# Patient Record
Sex: Male | Born: 1980 | Race: White | Hispanic: No | Marital: Single | Smoking: Current every day smoker
Health system: Southern US, Community
[De-identification: ages and names within clinical notes are randomized; demographics above are authoritative.]

---

## 2020-03-15 ENCOUNTER — Other Ambulatory Visit: Payer: Self-pay | Admitting: Gastroenterology

## 2020-03-15 DIAGNOSIS — R110 Nausea: Secondary | ICD-10-CM

## 2020-03-16 ENCOUNTER — Emergency Department (HOSPITAL_COMMUNITY)
Admission: EM | Admit: 2020-03-16 | Discharge: 2020-03-17 | Disposition: A | Discharge status: Other Acute Inpatient Hospital | Payer: PRIVATE HEALTH INSURANCE | Attending: Emergency Medicine | Admitting: Emergency Medicine

## 2020-03-16 ENCOUNTER — Encounter (HOSPITAL_COMMUNITY): Payer: Self-pay | Admitting: Emergency Medicine

## 2020-03-16 ENCOUNTER — Other Ambulatory Visit: Payer: Self-pay

## 2020-03-16 ENCOUNTER — Emergency Department (HOSPITAL_COMMUNITY): Payer: PRIVATE HEALTH INSURANCE

## 2020-03-16 DIAGNOSIS — Z20822 Contact with and (suspected) exposure to covid-19: Secondary | ICD-10-CM | POA: Insufficient documentation

## 2020-03-16 DIAGNOSIS — S61512A Laceration without foreign body of left wrist, initial encounter: Secondary | ICD-10-CM

## 2020-03-16 DIAGNOSIS — S0101XA Laceration without foreign body of scalp, initial encounter: Secondary | ICD-10-CM | POA: Insufficient documentation

## 2020-03-16 DIAGNOSIS — R109 Unspecified abdominal pain: Secondary | ICD-10-CM | POA: Insufficient documentation

## 2020-03-16 DIAGNOSIS — M542 Cervicalgia: Secondary | ICD-10-CM | POA: Insufficient documentation

## 2020-03-16 DIAGNOSIS — X838XXA Intentional self-harm by other specified means, initial encounter: Secondary | ICD-10-CM | POA: Insufficient documentation

## 2020-03-16 DIAGNOSIS — Y9289 Other specified places as the place of occurrence of the external cause: Secondary | ICD-10-CM | POA: Insufficient documentation

## 2020-03-16 DIAGNOSIS — Z23 Encounter for immunization: Secondary | ICD-10-CM | POA: Insufficient documentation

## 2020-03-16 DIAGNOSIS — T1491XA Suicide attempt, initial encounter: Secondary | ICD-10-CM

## 2020-03-16 DIAGNOSIS — F17210 Nicotine dependence, cigarettes, uncomplicated: Secondary | ICD-10-CM | POA: Insufficient documentation

## 2020-03-16 DIAGNOSIS — F32A Depression, unspecified: Secondary | ICD-10-CM

## 2020-03-16 DIAGNOSIS — F329 Major depressive disorder, single episode, unspecified: Secondary | ICD-10-CM | POA: Insufficient documentation

## 2020-03-16 DIAGNOSIS — Y9389 Activity, other specified: Secondary | ICD-10-CM | POA: Insufficient documentation

## 2020-03-16 DIAGNOSIS — S199XXA Unspecified injury of neck, initial encounter: Secondary | ICD-10-CM

## 2020-03-16 DIAGNOSIS — Y999 Unspecified external cause status: Secondary | ICD-10-CM | POA: Insufficient documentation

## 2020-03-16 LAB — COMPREHENSIVE METABOLIC PANEL
ALT: 33 U/L (ref 0–44)
AST: 29 U/L (ref 15–41)
Albumin: 3.8 g/dL (ref 3.5–5.0)
Alkaline Phosphatase: 68 U/L (ref 38–126)
Anion gap: 13 (ref 5–15)
BUN: 8 mg/dL (ref 6–20)
CO2: 19 mmol/L — ABNORMAL LOW (ref 22–32)
Calcium: 8.7 mg/dL — ABNORMAL LOW (ref 8.9–10.3)
Chloride: 106 mmol/L (ref 98–111)
Creatinine, Ser: 0.67 mg/dL (ref 0.61–1.24)
GFR calc Af Amer: >60 mL/min (ref >60)
GFR calc non Af Amer: >60 mL/min (ref >60)
Glucose, Bld: 127 mg/dL — ABNORMAL HIGH (ref 70–99)
Potassium: 3.9 mmol/L (ref 3.5–5.1)
Sodium: 138 mmol/L (ref 135–145)
Total Bilirubin: 0.8 mg/dL (ref 0.3–1.2)
Total Protein: 6.6 g/dL (ref 6.5–8.1)

## 2020-03-16 LAB — CBC WITH DIFFERENTIAL/PLATELET
Abs Immature Granulocytes: 0.05 10*3/uL (ref 0.00–0.07)
Basophils Absolute: 0 10*3/uL (ref 0.0–0.1)
Basophils Relative: 0 %
Eosinophils Absolute: 0 10*3/uL (ref 0.0–0.5)
Eosinophils Relative: 0 %
HCT: 44.4 % (ref 39.0–52.0)
Hemoglobin: 15.3 g/dL (ref 13.0–17.0)
Immature Granulocytes: 0 %
Lymphocytes Relative: 12 %
Lymphs Abs: 1.8 10*3/uL (ref 0.7–4.0)
MCH: 32.1 pg (ref 26.0–34.0)
MCHC: 34.5 g/dL (ref 30.0–36.0)
MCV: 93.3 fL (ref 80.0–100.0)
Monocytes Absolute: 1.1 10*3/uL — ABNORMAL HIGH (ref 0.1–1.0)
Monocytes Relative: 8 %
Neutro Abs: 11.6 10*3/uL — ABNORMAL HIGH (ref 1.7–7.7)
Neutrophils Relative %: 80 %
Platelets: 265 10*3/uL (ref 150–400)
RBC: 4.76 MIL/uL (ref 4.22–5.81)
RDW: 12.1 % (ref 11.5–15.5)
WBC: 14.7 10*3/uL — ABNORMAL HIGH (ref 4.0–10.5)
nRBC: 0 % (ref 0.0–0.2)

## 2020-03-16 LAB — SALICYLATE LEVEL: Salicylate Lvl: <7 mg/dL — ABNORMAL LOW (ref 7.0–30.0)

## 2020-03-16 LAB — URINALYSIS, ROUTINE W REFLEX MICROSCOPIC
Bilirubin Urine: NEGATIVE
Glucose, UA: NEGATIVE mg/dL
Hgb urine dipstick: NEGATIVE
Ketones, ur: 5 mg/dL — AB
Leukocytes,Ua: NEGATIVE
Nitrite: NEGATIVE
Protein, ur: NEGATIVE mg/dL
Specific Gravity, Urine: 1.032 — ABNORMAL HIGH (ref 1.005–1.030)
pH: 5 (ref 5.0–8.0)

## 2020-03-16 LAB — RAPID URINE DRUG SCREEN, HOSP PERFORMED
Amphetamines: NOT DETECTED
Barbiturates: NOT DETECTED
Benzodiazepines: NOT DETECTED
Cocaine: NOT DETECTED
Opiates: NOT DETECTED
Tetrahydrocannabinol: NOT DETECTED

## 2020-03-16 LAB — ACETAMINOPHEN LEVEL: Acetaminophen (Tylenol), Serum: <10 ug/mL — ABNORMAL LOW (ref 10–30)

## 2020-03-16 LAB — ETHANOL: Alcohol, Ethyl (B): 221 mg/dL — ABNORMAL HIGH (ref <10)

## 2020-03-16 LAB — SARS CORONAVIRUS 2 BY RT PCR (HOSPITAL ORDER, PERFORMED IN ~~LOC~~ HOSPITAL LAB): SARS Coronavirus 2: NEGATIVE

## 2020-03-16 MED ORDER — THIAMINE HCL 100 MG PO TABS
100.0000 mg | ORAL_TABLET | Freq: Every day | ORAL | Status: DC
  Administered 2020-03-16: 100 mg via ORAL
  Filled 2020-03-16: qty 1

## 2020-03-16 MED ORDER — LORAZEPAM 2 MG/ML IJ SOLN: 0.0000 mg | Freq: Two times a day (BID) | INTRAMUSCULAR | Status: DC

## 2020-03-16 MED ORDER — IBUPROFEN 400 MG PO TABS
600.0000 mg | ORAL_TABLET | Freq: Three times a day (TID) | ORAL | Status: DC | PRN
  Administered 2020-03-17: 600 mg via ORAL
  Filled 2020-03-16: qty 1

## 2020-03-16 MED ORDER — NICOTINE 21 MG/24HR TD PT24
21.0000 mg | MEDICATED_PATCH | Freq: Every day | TRANSDERMAL | Status: DC
  Filled 2020-03-16: qty 1

## 2020-03-16 MED ORDER — HYDROXYZINE HCL 25 MG PO TABS
25.0000 mg | ORAL_TABLET | Freq: Once | ORAL | Status: AC
  Administered 2020-03-16: 25 mg via ORAL
  Filled 2020-03-16: qty 1

## 2020-03-16 MED ORDER — ONDANSETRON HCL 4 MG PO TABS
4.0000 mg | ORAL_TABLET | Freq: Three times a day (TID) | ORAL | Status: DC | PRN
  Administered 2020-03-17: 4 mg via ORAL
  Filled 2020-03-16: qty 1

## 2020-03-16 MED ORDER — LORAZEPAM 1 MG PO TABS
0.0000 mg | ORAL_TABLET | Freq: Four times a day (QID) | ORAL | Status: DC
  Administered 2020-03-16: 1 mg via ORAL
  Filled 2020-03-16: qty 1

## 2020-03-16 MED ORDER — HYDROCODONE-ACETAMINOPHEN 5-325 MG PO TABS: 1.0000 | ORAL_TABLET | ORAL | Status: DC | PRN

## 2020-03-16 MED ORDER — TETANUS-DIPHTH-ACELL PERTUSSIS 5-2.5-18.5 LF-MCG/0.5 IM SUSP
0.5000 mL | Freq: Once | INTRAMUSCULAR | Status: AC
  Administered 2020-03-16: 0.5 mL via INTRAMUSCULAR
  Filled 2020-03-16: qty 0.5

## 2020-03-16 MED ORDER — LORAZEPAM 1 MG PO TABS: 0.0000 mg | ORAL_TABLET | Freq: Two times a day (BID) | ORAL | Status: DC

## 2020-03-16 MED ORDER — ZOLPIDEM TARTRATE 5 MG PO TABS
5.0000 mg | ORAL_TABLET | Freq: Every evening | ORAL | Status: DC | PRN
  Administered 2020-03-16: 5 mg via ORAL
  Filled 2020-03-16: qty 1

## 2020-03-16 MED ORDER — IOHEXOL 300 MG/ML  SOLN
100.0000 mL | Freq: Once | INTRAMUSCULAR | Status: AC | PRN
  Administered 2020-03-16: 100 mL via INTRAVENOUS

## 2020-03-16 MED ORDER — THIAMINE HCL 100 MG/ML IJ SOLN: 100.0000 mg | Freq: Every day | INTRAMUSCULAR | Status: DC

## 2020-03-16 MED ORDER — SODIUM CHLORIDE 0.9 % IV BOLUS
500.0000 mL | Freq: Once | INTRAVENOUS | Status: AC
  Administered 2020-03-16: 500 mL via INTRAVENOUS

## 2020-03-16 MED ORDER — HYDROCODONE-ACETAMINOPHEN 5-325 MG PO TABS
1.0000 | ORAL_TABLET | Freq: Once | ORAL | Status: AC
  Administered 2020-03-16: 1 via ORAL
  Filled 2020-03-16: qty 1

## 2020-03-16 MED ORDER — HYDROXYZINE HCL 25 MG PO TABS
25.0000 mg | ORAL_TABLET | Freq: Four times a day (QID) | ORAL | Status: DC | PRN
  Administered 2020-03-16: 25 mg via ORAL
  Filled 2020-03-16: qty 1

## 2020-03-16 MED ORDER — LORAZEPAM 2 MG/ML IJ SOLN: 0.0000 mg | Freq: Four times a day (QID) | INTRAMUSCULAR | Status: DC

## 2020-03-16 NOTE — ED Provider Notes (Signed)
[facility]   CSN: [id number] Arrival date & time: 03/16/20  1605     History Chief Complaint  Patient presents with   Fall   Suicide Attempt    William Mccoy is a 39 y.o. male.  HPI He presents by EMS for evaluation of suspected self-inflicted injuries.  Patient was apparently in a storage building, at her workplace, and was found on the couch, after EMS was called by his wife.  EMS report that they suspect that he hung himself, by climbing up onto a ladder.  They state that a ladder was present, but no clear sign of a noose.  They report that the ladder was a stepladder, about 6 feet tall.  There were extension cord scattered around and it is suspected that these could have been used as a ligature.  Patient also reported to them that he cut his left wrist, and an attempt at self-harm.  He is also reported to be intoxicated with alcohol.  He apparently sustained an injury to his head at some point.  I saw the patient immediately on arrival.  He was alert, and conversant.  He reports that he tried to hang himself using a extension cord.  He admits to injuring his left wrist intentionally.  When I asked him why he did that he would not state answered.  He is clearly intoxicated and has dysarthria and difficulty carrying on a lucid conversation.  Level 5 caveat-altered mental status    History reviewed. No pertinent past medical history.  There are no problems to display for this patient.   History reviewed. No pertinent surgical history.     History reviewed. No pertinent family history.  Social History   Tobacco Use   Smoking status: Current Every Day Smoker    Packs/day: 1.00    Types: Cigarettes   Smokeless tobacco: Never Used  Substance Use Topics   Alcohol use: Yes    Alcohol/week: 6.0 standard drinks    Types: 6 Shots of liquor per week    Comment: every day   Drug use: Never    Home Medications Prior  to Admission medications   Medication Sig Start Date End Date Taking? Authorizing Provider  melatonin 1 MG TABS tablet Take 3 mg by mouth at bedtime as needed (sleep).   Yes [provider]  nicotine (NICODERM CQ - DOSED IN MG/24 HOURS) 14 mg/24hr patch Place 14 mg onto the skin daily.   Yes [provider]    Allergies    Patient has no known allergies.  Review of Systems   Review of Systems  Unable to perform ROS: Mental status change    Physical Exam Updated Vital Signs BP 118/87    Pulse (!) 104    Temp 98.3 F (36.8 C) (Oral)    Resp (!) 21    SpO2 96%   Physical Exam Vitals and nursing note reviewed.  Constitutional:      General: He is in acute distress.     Appearance: He is well-developed. He is ill-appearing and toxic-appearing. He is not diaphoretic.  HENT:     Head: Normocephalic and atraumatic.     Right Ear: External ear normal.     Left Ear: External ear normal.  Eyes:     Conjunctiva/sclera: Conjunctivae normal.     Pupils: Pupils are equal, round, and reactive to light.  Neck:     Trachea: Phonation normal.  Cardiovascular:  Rate and Rhythm: Normal rate and regular rhythm.     Heart sounds: Normal heart sounds.  Pulmonary:     Effort: Pulmonary effort is normal.     Breath sounds: Normal breath sounds.  Chest:     Chest wall: No tenderness.  Abdominal:     General: There is no distension.     Palpations: Abdomen is soft. There is no mass.     Tenderness: There is abdominal tenderness.     Hernia: No hernia is present.  Musculoskeletal:        General: Normal range of motion.     Cervical back: Normal range of motion and neck supple.     Comments: Superficial horizontal laceration left volar wrist, without swelling, or deep injury.  Skin:    General: Skin is warm and dry.  Neurological:     Mental Status: He is alert.     Motor: No abnormal muscle tone.     Comments: Dysarthria is present.  He moves extremities equally.  No  facial asymmetry.  He does not follow commands well for strength testing.  Psychiatric:        Attention and Perception: He is inattentive.        Mood and Affect: Mood is depressed.        Speech: Speech is delayed.        Behavior: Behavior is slowed.        Thought Content: Thought content includes suicidal ideation. Thought content includes suicidal plan.        Cognition and Memory: Cognition is impaired.     ED Results / Procedures / Treatments   Labs (all labs ordered are listed, but only abnormal results are displayed) Labs Reviewed  COMPREHENSIVE METABOLIC PANEL - Abnormal; Notable for the following components:      Result Value   CO2 19 (*)    Glucose, Bld 127 (*)    Calcium 8.7 (*)    All other components within normal limits  CBC WITH DIFFERENTIAL/PLATELET - Abnormal; Notable for the following components:   WBC 14.7 (*)    Neutro Abs 11.6 (*)    Monocytes Absolute 1.1 (*)    All other components within normal limits  SALICYLATE LEVEL - Abnormal; Notable for the following components:   Salicylate Lvl <7.0 (*)    All other components within normal limits  ACETAMINOPHEN LEVEL - Abnormal; Notable for the following components:   Acetaminophen (Tylenol), Serum <10 (*)    All other components within normal limits  ETHANOL - Abnormal; Notable for the following components:   Alcohol, Ethyl (B) 221 (*)    All other components within normal limits  URINALYSIS, ROUTINE W REFLEX MICROSCOPIC - Abnormal; Notable for the following components:   Specific Gravity, Urine 1.032 (*)    Ketones, ur 5 (*)    All other components within normal limits  SARS CORONAVIRUS 2 BY RT PCR (HOSPITAL ORDER, PERFORMED IN [facility])  RAPID URINE DRUG SCREEN, HOSP PERFORMED    EKG None  Radiology CT Head Wo Contrast  Result Date: 03/16/2020 CLINICAL DATA:  Polytrauma, critical, head/C-spine injury suspected EXAM: CT HEAD WITHOUT CONTRAST TECHNIQUE: Contiguous axial images were  obtained from the base of the skull through the vertex without intravenous contrast. COMPARISON:  None. FINDINGS: Brain: No intracranial hemorrhage, mass effect, or midline shift. Gray-white differentiation is preserved. No hydrocephalus. The basilar cisterns are patent. No evidence of territorial infarct or acute ischemia. No extra-axial or intracranial fluid  collection. Vascular: No hyperdense vessel or unexpected calcification. Skull: No fracture or focal lesion. Sinuses/Orbits: Right frontal osteoma. No acute findings. Included orbits are unremarkable. Mastoid air cells are clear. Other: None. IMPRESSION: No acute intracranial abnormality. No skull fracture. Electronically Signed   By: Narda Rutherford M.D.   On: 03/16/2020 19:30   CT Chest W Contrast  Result Date: 03/16/2020 CLINICAL DATA:  Concern for fall secondary to suicide attempt. EXAM: CT CHEST, ABDOMEN, AND PELVIS WITH CONTRAST TECHNIQUE: Multidetector CT imaging of the chest, abdomen and pelvis was performed following the standard protocol during bolus administration of intravenous contrast. CONTRAST:  OMNIPAQUE IOHEXOL 300 MG/ML  SOLN COMPARISON:  None. FINDINGS: CT CHEST FINDINGS Cardiovascular: The thoracic aorta is unremarkable. The heart size is unremarkable. There is no significant pericardial effusion. There is no large centrally located pulmonary embolism. Mediastinum/Nodes: -- No mediastinal lymphadenopathy. -- No hilar lymphadenopathy. -- No axillary lymphadenopathy. -- No supraclavicular lymphadenopathy. -- Normal thyroid gland where visualized. -  Unremarkable esophagus. Lungs/Pleura: Airways are patent. No pleural effusion, lobar consolidation, pneumothorax or pulmonary infarction. Musculoskeletal: No chest wall abnormality. No bony spinal canal stenosis. CT ABDOMEN PELVIS FINDINGS Hepatobiliary: The liver is normal. Normal gallbladder.There is no biliary ductal dilation. Pancreas: Normal contours without ductal dilatation. No  peripancreatic fluid collection. Spleen: Unremarkable. Adrenals/Urinary Tract: --Adrenal glands: Unremarkable. --Right kidney/ureter: No hydronephrosis or radiopaque kidney stones. --Left kidney/ureter: No hydronephrosis or radiopaque kidney stones. --Urinary bladder: Unremarkable. Stomach/Bowel: --Stomach/Duodenum: No hiatal hernia or other gastric abnormality. Normal duodenal course and caliber. --Small bowel: Unremarkable. --Colon: Unremarkable. --Appendix: Normal. Vascular/Lymphatic: Normal course and caliber of the major abdominal vessels. --No retroperitoneal lymphadenopathy. --No mesenteric lymphadenopathy. --No pelvic or inguinal lymphadenopathy. Reproductive: Unremarkable Other: No ascites or free air. The abdominal wall is normal. Musculoskeletal. 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 IMPRESSION: No acute thoracic, abdominal or pelvic injury. Electronically Signed   By: Katherine Mantle M.D.   On: 03/16/2020 19:40   CT Cervical Spine Wo Contrast  Result Date: 03/16/2020 CLINICAL DATA:  Polytrauma, critical, head/C-spine injury suspected EXAM: CT CERVICAL SPINE WITHOUT CONTRAST TECHNIQUE: Multidetector CT imaging of the cervical spine was performed without intravenous contrast. Multiplanar CT image reconstructions were also generated. COMPARISON:  None. FINDINGS: Alignment: Normal. Skull base and vertebrae: No acute fracture. Vertebral body heights are maintained. The dens and skull base are intact. Soft tissues and spinal canal: No prevertebral fluid or swelling. No visible canal hematoma. Mild soft tissue edema anterior to the left sternocleidomastoid muscle. Disc levels: Endplate spurring from C3-C4 through C6-C7. Mild scattered disc space narrowing. Upper chest: Assessed on concurrent chest CT, reported separately. Other: None. IMPRESSION: Mild degenerative change in the cervical spine without acute fracture or subluxation. Electronically Signed    By: Narda Rutherford M.D.   On: 03/16/2020 19:33   CT Abdomen Pelvis W Contrast  Result Date: 03/16/2020 CLINICAL DATA:  Concern for fall secondary to suicide attempt. EXAM: CT CHEST, ABDOMEN, AND PELVIS WITH CONTRAST TECHNIQUE: Multidetector CT imaging of the chest, abdomen and pelvis was performed following the standard protocol during bolus administration of intravenous contrast. CONTRAST:  OMNIPAQUE IOHEXOL 300 MG/ML  SOLN COMPARISON:  None. FINDINGS: CT CHEST FINDINGS Cardiovascular: The thoracic aorta is unremarkable. The heart size is unremarkable. There is no significant pericardial effusion. There is no large centrally located pulmonary embolism. Mediastinum/Nodes: -- No mediastinal lymphadenopathy. -- No hilar lymphadenopathy. -- No axillary lymphadenopathy. -- No supraclavicular lymphadenopathy. -- Normal  thyroid gland where visualized. -  Unremarkable esophagus. Lungs/Pleura: Airways are patent. No pleural effusion, lobar consolidation, pneumothorax or pulmonary infarction. Musculoskeletal: No chest wall abnormality. No bony spinal canal stenosis. CT ABDOMEN PELVIS FINDINGS Hepatobiliary: The liver is normal. Normal gallbladder.There is no biliary ductal dilation. Pancreas: Normal contours without ductal dilatation. No peripancreatic fluid collection. Spleen: Unremarkable. Adrenals/Urinary Tract: --Adrenal glands: Unremarkable. --Right kidney/ureter: No hydronephrosis or radiopaque kidney stones. --Left kidney/ureter: No hydronephrosis or radiopaque kidney stones. --Urinary bladder: Unremarkable. Stomach/Bowel: --Stomach/Duodenum: No hiatal hernia or other gastric abnormality. Normal duodenal course and caliber. --Small bowel: Unremarkable. --Colon: Unremarkable. --Appendix: Normal. Vascular/Lymphatic: Normal course and caliber of the major abdominal vessels. --No retroperitoneal lymphadenopathy. --No mesenteric lymphadenopathy. --No pelvic or inguinal lymphadenopathy. Reproductive: Unremarkable  Other: No ascites or free air. The abdominal wall is normal. Musculoskeletal. 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 IMPRESSION: No acute thoracic, abdominal or pelvic injury. Electronically Signed   By: Katherine Mantle M.D.   On: 03/16/2020 19:40    Procedures .Marland KitchenLaceration Repair  Date/Time: 03/16/2020 8:35 PM Performed by: Mancel Bale, MD Authorized by: Mancel Bale, MD   Consent:    Consent obtained:  Verbal   Consent given by:  Patient   Risks discussed:  Pain and need for additional repair   Alternatives discussed:  No treatment Anesthesia (see MAR for exact dosages):    Anesthesia method:  None Laceration details:    Location:  Scalp   Length (cm):  3   Depth (mm):  9 Repair type:    Repair type:  Simple Exploration:    Hemostasis achieved with:  Direct pressure   Wound exploration: wound explored through full range of motion     Wound extent: no areolar tissue violation noted, no fascia violation noted, no foreign bodies/material noted, no muscle damage noted and no vascular damage noted     Contaminated: no   Treatment:    Area cleansed with:  Saline   Amount of cleaning:  Standard   Irrigation solution:  Sterile saline   Irrigation volume:  20 cc   Irrigation method:  Syringe   Visualized foreign bodies/material removed: no   Skin repair:    Repair method:  Staples   Number of staples:  2 Approximation:    Approximation:  Loose Post-procedure details:    Dressing:  Antibiotic ointment   Patient tolerance of procedure:  Tolerated well, no immediate complications .Critical Care Performed by: Mancel Bale, MD Authorized by: Mancel Bale, MD   Critical care provider statement:    Critical care time (minutes):  35   Critical care start time:  03/16/2020 4:05 PM   Critical care end time:  03/16/2020 8:42 PM   Critical care time was exclusive of:  Separately billable procedures and treating other patients    Critical care was necessary to treat or prevent imminent or life-threatening deterioration of the following conditions:  Toxidrome (Psychiatric crisis)   Critical care was time spent personally by me on the following activities:  Blood draw for specimens, development of treatment plan with patient or surrogate, discussions with consultants, evaluation of patient's response to treatment, examination of patient, obtaining history from patient or surrogate, ordering and performing treatments and interventions, ordering and review of laboratory studies, pulse oximetry, re-evaluation of patient's condition, review of old charts and ordering and review of radiographic studies   (including critical care time)  Medications Ordered in ED Medications  ibuprofen (ADVIL) tablet 600  mg (has no administration in time range)  zolpidem (AMBIEN) tablet 5 mg (has no administration in time range)  ondansetron (ZOFRAN) tablet 4 mg (has no administration in time range)  HYDROcodone-acetaminophen (NORCO/VICODIN) 5-325 MG per tablet 1 tablet (has no administration in time range)  hydrOXYzine (ATARAX/VISTARIL) tablet 25 mg (has no administration in time range)  sodium chloride 0.9 % bolus 500 mL (0 mLs Intravenous Stopped 03/16/20 1736)  iohexol (OMNIPAQUE) 300 MG/ML solution 100 mL (100 mLs Intravenous Contrast Given 03/16/20 1856)  Tdap (BOOSTRIX) injection 0.5 mL (0.5 mLs Intramuscular Given 03/16/20 2027)  hydrOXYzine (ATARAX/VISTARIL) tablet 25 mg (25 mg Oral Given 03/16/20 2024)  HYDROcodone-acetaminophen (NORCO/VICODIN) 5-325 MG per tablet 1 tablet (1 tablet Oral Given 03/16/20 2040)    ED Course  I have reviewed the triage vital signs and the nursing notes.  Pertinent labs & imaging results that were available during my care of the patient were reviewed by me and considered in my medical decision making (see chart for details).  Clinical Course as of Mar 17 2043  Thu Mar 16, 2020  1615 Involuntary commitment and  first opinion paperwork forms completed by me.   [EW]  1738 Abnormal, elevated  Ethanol(!) [EW]  1738 Normal except white count high with left shift  CBC with Differential(!) [EW]  1739 Normal  Acetaminophen level(!) [EW]  1739 Normal  Salicylate level(!) [EW]  1739 Normal except CO2 low, glucose high, calcium low  Comprehensive metabolic panel(!) [EW]  1957 Normal  Urinalysis, Routine w reflex microscopic(!) [EW]  2016 All CT images reviewed and interpreted by radiology, negative for acute abnormalities.   [EW]  2016 Patient's wife is now here and states that he has been depressed for several months, and that things are worsening since he has violated his probation, and will likely need to return to jail.  He was released from prison about 5 years ago.  Patient does not currently see psychiatry or receive psychiatric treatment.  He understands that he is under IVC and will have to stay until seen by psychiatry.   [EW]  2026 Normal  Urine rapid drug screen (hosp performed) [EW]  2040 He is medically cleared for treatment and admission by psychiatry.  No traumatic injuries.  No metabolic or infectious processes.   [EW]    Clinical Course User Index [EW] Mancel Bale, MD   MDM Rules/Calculators/A&P                           Patient Vitals for the past 24 hrs:  BP Temp Temp src Pulse Resp SpO2  03/16/20 2030 118/87 -- -- (!) 104 (!) 21 96 %  03/16/20 1945 125/77 -- -- (!) 108 18 95 %  03/16/20 1930 123/72 -- -- (!) 104 (!) 28 94 %  03/16/20 1858 129/73 -- -- (!) 103 14 97 %  03/16/20 1800 107/63 -- -- (!) 105 (!) 26 95 %  03/16/20 1745 106/63 -- -- (!) 107 (!) 29 94 %  03/16/20 1715 108/69 -- -- (!) 107 (!) 30 93 %  03/16/20 1649 119/74 -- -- (!) 110 (!) 32 92 %  03/16/20 1639 -- -- -- (!) 115 20 94 %  03/16/20 1634 129/86 -- -- (!) 112 (!) 8 (!) 89 %  03/16/20 1617 (!) 125/96 98.3 F (36.8 C) Oral (!) 117 16 98 %  03/16/20 1613 -- -- -- -- -- 95 %    8:39 PM  Reevaluation with  update and discussion. After initial assessment and treatment, an updated evaluation reveals he is now awake alert and cooperative.  Wife here and supportive.  Patient has been medically cleared for treatment by psychiatry. Mancel Bale   Medical Decision Making:  This patient is presenting for evaluation of suicide attempt by hanging, which does require a range of treatment options, and is a complaint that involves a high risk of morbidity and mortality. The differential diagnoses include traumatic injuries, toxic injury, uncontrolled depression. I decided to review old records, and in summary patient with current legal troubles, and worsening depression, without outpatient treatment.  I obtained additional historical information from his wife at the bedside.  Clinical Laboratory Tests Ordered, included CBC, Metabolic panel and Toxic ingestion labs. Review indicates normal except elevated alcohol level. Radiologic Tests Ordered, included CTs head, cervical spine, chest abdomen pelvis..  I independently Visualized: Radiographic images, which show no traumatic injuries  Cardiac Monitor Tracing which shows normal sinus rhythm    Critical Interventions-clinical evaluation, involuntary commitment completed, laboratory testing, CT imaging, observation, laceration repair, reassessment, medical clearance.  After These Interventions, the Patient was reevaluated and was found stable for evaluation treatment with psychiatry with placement as needed.  CRITICAL CARE-yes Performed by: Mancel Bale  Nursing Notes Reviewed/ Care Coordinated Applicable Imaging Reviewed Interpretation of Laboratory Data incorporated into ED treatment  Plan-consultation TTS.  Disposition as per TTS in conjunction with oncoming provider team.  Staple removal scalp 7 days.  Symptomatic care, superficial left wrist laceration.   It was postponed under her Final Clinical Impression(s) / ED  Diagnoses Final diagnoses:  Suicide attempt (HCC)  Depression, unspecified depression type  Injury of neck, initial encounter  Laceration of left wrist, initial encounter    Rx / DC Orders ED Discharge Orders    None       Mancel Bale, MD 03/16/20 2045

## 2020-03-16 NOTE — ED Triage Notes (Signed)
Pt here via [facility] for suicide attempt and possible fall. 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 At scene, there was a 12ft ladder, multiple extension cords, and an empty bottle of everclear. Per wife, pt is supposed to be arrested. Pt admits to attempting to hang himself. C/o pain everywhere. Has lac to posterior head, and L wrist, ligature marks to R neck. VSS.

## 2020-03-16 NOTE — ED Notes (Signed)
Patient transported to CT 
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
## 2020-03-16 NOTE — ED Notes (Signed)
TTS in process 

## 2020-03-17 ENCOUNTER — Encounter (HOSPITAL_COMMUNITY): Payer: Self-pay | Admitting: Psychiatry

## 2020-03-17 ENCOUNTER — Inpatient Hospital Stay (HOSPITAL_COMMUNITY)
Admission: AD | Admit: 2020-03-17 | Discharge: 2020-03-25 | DRG: 885 | Disposition: A | Discharge status: Home / Self Care | Payer: Federal, State, Local not specified - Other | Attending: Psychiatry | Admitting: Psychiatry

## 2020-03-17 DIAGNOSIS — Z915 Personal history of self-harm: Secondary | ICD-10-CM | POA: Diagnosis not present

## 2020-03-17 DIAGNOSIS — F419 Anxiety disorder, unspecified: Secondary | ICD-10-CM | POA: Diagnosis present

## 2020-03-17 DIAGNOSIS — Y907 Blood alcohol level of 200-239 mg/100 ml: Secondary | ICD-10-CM | POA: Diagnosis present

## 2020-03-17 DIAGNOSIS — S0101XA Laceration without foreign body of scalp, initial encounter: Secondary | ICD-10-CM | POA: Diagnosis present

## 2020-03-17 DIAGNOSIS — T71162A Asphyxiation due to hanging, intentional self-harm, initial encounter: Secondary | ICD-10-CM | POA: Diagnosis present

## 2020-03-17 DIAGNOSIS — G4700 Insomnia, unspecified: Secondary | ICD-10-CM | POA: Diagnosis present

## 2020-03-17 DIAGNOSIS — F17210 Nicotine dependence, cigarettes, uncomplicated: Secondary | ICD-10-CM | POA: Diagnosis present

## 2020-03-17 DIAGNOSIS — F909 Attention-deficit hyperactivity disorder, unspecified type: Secondary | ICD-10-CM | POA: Diagnosis present

## 2020-03-17 DIAGNOSIS — F1024 Alcohol dependence with alcohol-induced mood disorder: Secondary | ICD-10-CM

## 2020-03-17 DIAGNOSIS — R1310 Dysphagia, unspecified: Secondary | ICD-10-CM | POA: Diagnosis present

## 2020-03-17 DIAGNOSIS — Z79899 Other long term (current) drug therapy: Secondary | ICD-10-CM | POA: Diagnosis not present

## 2020-03-17 DIAGNOSIS — F10229 Alcohol dependence with intoxication, unspecified: Secondary | ICD-10-CM | POA: Diagnosis present

## 2020-03-17 DIAGNOSIS — F332 Major depressive disorder, recurrent severe without psychotic features: Principal | ICD-10-CM

## 2020-03-17 DIAGNOSIS — Z20822 Contact with and (suspected) exposure to covid-19: Secondary | ICD-10-CM | POA: Diagnosis present

## 2020-03-17 MED ORDER — THIAMINE HCL 100 MG PO TABS
100.0000 mg | ORAL_TABLET | Freq: Every day | ORAL | Status: DC
  Administered 2020-03-18 – 2020-03-25 (×8): 100 mg via ORAL
  Filled 2020-03-17 (×10): qty 1

## 2020-03-17 MED ORDER — LORAZEPAM 1 MG PO TABS
1.0000 mg | ORAL_TABLET | Freq: Every day | ORAL | Status: AC
  Filled 2020-03-17: qty 1

## 2020-03-17 MED ORDER — LORAZEPAM 1 MG PO TABS
1.0000 mg | ORAL_TABLET | Freq: Three times a day (TID) | ORAL | Status: AC
  Administered 2020-03-18 (×3): 1 mg via ORAL
  Filled 2020-03-17 (×3): qty 1

## 2020-03-17 MED ORDER — ACETAMINOPHEN 325 MG PO TABS
650.0000 mg | ORAL_TABLET | Freq: Four times a day (QID) | ORAL | Status: DC | PRN
  Administered 2020-03-18: 650 mg via ORAL
  Filled 2020-03-17 (×2): qty 2

## 2020-03-17 MED ORDER — ONDANSETRON 4 MG PO TBDP: 4.0000 mg | ORAL_TABLET | Freq: Four times a day (QID) | ORAL | Status: AC | PRN

## 2020-03-17 MED ORDER — ADULT MULTIVITAMIN W/MINERALS CH
1.0000 | ORAL_TABLET | Freq: Every day | ORAL | Status: DC
  Administered 2020-03-17 – 2020-03-25 (×9): 1 via ORAL
  Filled 2020-03-17 (×11): qty 1

## 2020-03-17 MED ORDER — CITALOPRAM HYDROBROMIDE 10 MG PO TABS
10.0000 mg | ORAL_TABLET | Freq: Every day | ORAL | Status: DC
  Administered 2020-03-18: 10 mg via ORAL
  Filled 2020-03-17 (×3): qty 1

## 2020-03-17 MED ORDER — LORAZEPAM 1 MG PO TABS
1.0000 mg | ORAL_TABLET | Freq: Four times a day (QID) | ORAL | Status: AC
  Administered 2020-03-17 (×2): 1 mg via ORAL
  Filled 2020-03-17 (×3): qty 1

## 2020-03-17 MED ORDER — HYDROXYZINE HCL 25 MG PO TABS
25.0000 mg | ORAL_TABLET | Freq: Four times a day (QID) | ORAL | Status: DC | PRN
  Administered 2020-03-18 – 2020-03-20 (×2): 25 mg via ORAL
  Filled 2020-03-17 (×3): qty 1

## 2020-03-17 MED ORDER — LOPERAMIDE HCL 2 MG PO CAPS: 2.0000 mg | ORAL_CAPSULE | ORAL | Status: DC | PRN

## 2020-03-17 MED ORDER — LORAZEPAM 1 MG PO TABS
1.0000 mg | ORAL_TABLET | Freq: Four times a day (QID) | ORAL | Status: AC | PRN
  Administered 2020-03-18 – 2020-03-20 (×3): 1 mg via ORAL
  Filled 2020-03-17 (×2): qty 1

## 2020-03-17 MED ORDER — MAGNESIUM HYDROXIDE 400 MG/5ML PO SUSP: 30.0000 mL | Freq: Every day | ORAL | Status: DC | PRN

## 2020-03-17 MED ORDER — LORAZEPAM 1 MG PO TABS
1.0000 mg | ORAL_TABLET | Freq: Two times a day (BID) | ORAL | Status: AC
  Administered 2020-03-19 (×2): 1 mg via ORAL
  Filled 2020-03-17 (×2): qty 1

## 2020-03-17 MED ORDER — ALUM & MAG HYDROXIDE-SIMETH 200-200-20 MG/5ML PO SUSP: 30.0000 mL | ORAL | Status: DC | PRN

## 2020-03-17 NOTE — BH Assessment (Signed)
Pt has been accepted to [facility] and can arrive at any time. This information was provided to pt's nurse, Gabriel Rung RN, at [phone].  Room: 301-2 Accepting: Nira Conn, NP Attending: Dr. Jola Babinski Call to Report: [phone]

## 2020-03-17 NOTE — Progress Notes (Signed)
Admission Note: Patient is a 39 year old male admitted to the unit for suicidal ideation, depression and alcohol abuse.  Patient is alert and oriented x 4. Presents with a flat affect and depressed mood.  Reports passive SI but verbally contracts for safety.  Per report: patient had cut his left wrist and attempted to hang himself.  Reports marital issues and life struggles as stressors.  States he is here to get some rest and to start thinking straight.  Admission plan of care reviewed and treatment consent signed.  Skin assessment completed.  Skin laceration noted on left wrist, staples (2) intact noted at the occipital area.  Stated he fell and hit his head when the rope broke.  Patient oriented to the unit, staff and room.  Routine safety checks initiated.  Verbalizes understanding of unit rules/protocols.  Patient is safe on the unit.

## 2020-03-17 NOTE — ED Notes (Signed)
Pt's wife updated on transportation to [facility].

## 2020-03-17 NOTE — H&P (Addendum)
Psychiatric Admission Assessment Adult  Patient Identification: William Mccoy MRN:  [id number] Date of Evaluation:  03/17/2020 Chief Complaint:  Severe recurrent major depression without psychotic features ([place]) [F33.2] Principal Diagnosis: <principal problem not specified> Diagnosis:  Active Problems:   Severe recurrent major depression without psychotic features ([place])   Alcohol dependence with alcohol-induced mood disorder (HCC)  History of Present Illness: Patient is a 39 year old married male, self employed in [employer] brought to ER by EMS after suicide attempt by hanging himself. Patient is admitted to inpatient unit for further evaluation and stabilization. Today, patient feels guilty and states it's was really selfish of him to do that, hurting everybody around him. He explained that he was under lots of "stress" and just could not handle it. He had suicidal ideations on the day of the admission and tried to hang himself by putting noose of electrical cord around his neck. But it snapped, he fell and may have lost consciousness. Earlier that day he had an argument with his child's mother, he was drinking and as per patient " I think the alcohol just made me act irrationally". He reports he has been experiencing depression ( in & out by days) and anxiety for the past year. He adds he had been experiencing intermittent suicidal ideations but never had plan. He states he has been sleeping ok but it is extremely difficult for him to get out of bed in the morning. He reports regular, frequently daily, alcohol consumption and has been drinking daily and heavily ( mainly Vodka) which he states he was sometimes putting into a water bottle to hide use from family members. He denies substance abuse. He reports smoking 1 PPD and h/o alcohol dependence in paternal grandfather. He stated he had tried antidepressants in past but it was long time ago and does not remember the name. He denies AVH. He also  complains of headache, neck pain, sore throat but no withdrawal symptoms yet.   He sustained scalp laceration ( occipital area) which was sutured . ( Head /Spine CT were negative for acute trauma or fx) . He endorses neuro-vegetative symptoms- poor energy level, decreased appetite, anhedonia. He has h/o one prior psychiatric admission 20 years ago related to suicidal attempt by overdosing at the time. He describes history of depression which he characterizes as intermittent.Denies history of mania or hypomania, denies history of psychosis.  Denies history of severe alcohol WDL symptoms, denies history of seizures or DTs. Denies medical illnesses, NKDA. Associated Signs/Symptoms: Depression Symptoms:  depressed mood, anhedonia, insomnia, fatigue, difficulty concentrating, suicidal thoughts without plan, suicidal attempt, anxiety, loss of energy/fatigue, (Hypo) Manic Symptoms:  NA Anxiety Symptoms:  NA Psychotic Symptoms:  NA PTSD Symptoms: NA Total Time spent with patient: 30 minutes  Past Psychiatric History: He has one suicide attempt 20 years ago by overdose.  Is the patient at risk to self? No.  Has the patient been a risk to self in the past 6 months? Yes.    Has the patient been a risk to self within the distant past? Yes.    Is the patient a risk to others? No.  Has the patient been a risk to others in the past 6 months? No.  Has the patient been a risk to others within the distant past? No.   Prior Inpatient Therapy:   Prior Outpatient Therapy:    Alcohol Screening: 1. How often do you have a drink containing alcohol?: 4 or more times a week 2. How many  drinks containing alcohol do you have on a typical day when you are drinking?: 1 or 2 3. How often do you have six or more drinks on one occasion?: Weekly AUDIT-C Score: 7 4. How often during the last year have you found that you were not able to stop drinking once you had started?: Never 5. How often during the last  year have you failed to do what was normally expected from you because of drinking?: Never 6. How often during the last year have you needed a first drink in the morning to get yourself going after a heavy drinking session?: Never 7. How often during the last year have you had a feeling of guilt of remorse after drinking?: Never 8. How often during the last year have you been unable to remember what happened the night before because you had been drinking?: Never 9. Have you or someone else been injured as a result of your drinking?: No 10. Has a relative or friend or a doctor or another health worker been concerned about your drinking or suggested you cut down?: No Alcohol Use Disorder Identification Test Final Score (AUDIT): 7 Substance Abuse History in the last 12 months:  No. Consequences of Substance Abuse: NA Previous Psychotropic Medications: Yes  Psychological Evaluations: Yes  Past Medical History: History reviewed. No pertinent past medical history. History reviewed. No pertinent surgical history. Family History: History reviewed. No pertinent family history. Family Psychiatric  History: NA  Tobacco Screening:   Social History:  Social History   Substance and Sexual Activity  Alcohol Use Yes  . Alcohol/week: 6.0 standard drinks  . Types: 6 Shots of liquor per week   Comment: every day     Social History   Substance and Sexual Activity  Drug Use Never    Additional Social History:                           Allergies:  No Known Allergies Lab Results:  Results for orders placed or performed during the hospital encounter of 03/16/20 (from the past 48 hour(s))  Comprehensive metabolic panel     Status: Abnormal   Collection Time: 03/16/20  4:32 PM  Result Value Ref Range   Sodium 138 135 - 145 mmol/L   Potassium 3.9 3.5 - 5.1 mmol/L   Chloride 106 98 - 111 mmol/L   CO2 19 (L) 22 - 32 mmol/L   Glucose, Bld 127 (H) 70 - 99 mg/dL    Comment: Glucose reference  range applies only to samples taken after fasting for at least 8 hours.   BUN 8 6 - 20 mg/dL   Creatinine, Ser 0.67 0.61 - 1.24 mg/dL   Calcium 8.7 (L) 8.9 - 10.3 mg/dL   Total Protein 6.6 6.5 - 8.1 g/dL   Albumin 3.8 3.5 - 5.0 g/dL   AST 29 15 - 41 U/L   ALT 33 0 - 44 U/L   Alkaline Phosphatase 68 38 - 126 U/L   Total Bilirubin 0.8 0.3 - 1.2 mg/dL   GFR calc non Af Amer >60 >60 mL/min   GFR calc Af Amer >60 >60 mL/min   Anion gap 13 5 - 15    Comment: Performed at [facility], [place] [address], [place], [place] [address]  CBC with Differential     Status: Abnormal   Collection Time: 03/16/20  4:32 PM  Result Value Ref Range   WBC 14.7 (H) 4.0 -  10.5 K/uL   RBC 4.76 4.22 - 5.81 MIL/uL   Hemoglobin 15.3 13.0 - 17.0 g/dL   HCT 44.4 39 - 52 %   MCV 93.3 80.0 - 100.0 fL   MCH 32.1 26.0 - 34.0 pg   MCHC 34.5 30.0 - 36.0 g/dL   RDW 12.1 11.5 - 15.5 %   Platelets 265 150 - 400 K/uL   nRBC 0.0 0.0 - 0.2 %   Neutrophils Relative % 80 %   Neutro Abs 11.6 (H) 1.7 - 7.7 K/uL   Lymphocytes Relative 12 %   Lymphs Abs 1.8 0.7 - 4.0 K/uL   Monocytes Relative 8 %   Monocytes Absolute 1.1 (H) 0 - 1 K/uL   Eosinophils Relative 0 %   Eosinophils Absolute 0.0 0 - 0 K/uL   Basophils Relative 0 %   Basophils Absolute 0.0 0 - 0 K/uL   Immature Granulocytes 0 %   Abs Immature Granulocytes 0.05 0.00 - 0.07 K/uL    Comment: Performed at [facility] [address] [place], [place] [address]  Salicylate level     Status: Abnormal   Collection Time: 03/16/20  4:32 PM  Result Value Ref Range   Salicylate Lvl <1.7 (L) 7.0 - 30.0 mg/dL    Comment: Performed at [facility] [address] [place], [place] [address]  Acetaminophen level     Status: Abnormal   Collection Time: 03/16/20  4:32 PM  Result Value Ref Range   Acetaminophen (Tylenol), Serum <10 (L) 10 - 30 ug/mL    Comment: (NOTE) Therapeutic concentrations vary significantly. A range of 10-30 ug/mL  may be an effective  concentration for many patients. However, some  are best treated at concentrations outside of this range. Acetaminophen concentrations >150 ug/mL at 4 hours after ingestion  and >50 ug/mL at 12 hours after ingestion are often associated with  toxic reactions.  Performed at [facility], [place] [address] [place], [place] [address]   Ethanol     Status: Abnormal   Collection Time: 03/16/20  4:32 PM  Result Value Ref Range   Alcohol, Ethyl (B) 221 (H) <10 mg/dL    Comment: (NOTE) Lowest detectable limit for serum alcohol is 10 mg/dL.  For medical purposes only. Performed at [facility], [place] [address] [place], [place] [address]   Urine rapid drug screen (hosp performed)     Status: None   Collection Time: 03/16/20  7:26 PM  Result Value Ref Range   Opiates NONE DETECTED NONE DETECTED   Cocaine NONE DETECTED NONE DETECTED   Benzodiazepines NONE DETECTED NONE DETECTED   Amphetamines NONE DETECTED NONE DETECTED   Tetrahydrocannabinol NONE DETECTED NONE DETECTED   Barbiturates NONE DETECTED NONE DETECTED    Comment: (NOTE) DRUG SCREEN FOR MEDICAL PURPOSES ONLY.  IF CONFIRMATION IS NEEDED FOR ANY PURPOSE, NOTIFY LAB WITHIN 5 DAYS.  LOWEST DETECTABLE LIMITS FOR URINE DRUG SCREEN Drug Class                     Cutoff (ng/mL) Amphetamine and metabolites    1000 Barbiturate and metabolites    200 Benzodiazepine                 992 Tricyclics and metabolites     300 Opiates and metabolites        300 Cocaine and metabolites        300 THC  50 Performed at [facility], [place] [address] [place], [place] [address]   Urinalysis, Routine w reflex microscopic     Status: Abnormal   Collection Time: 03/16/20  7:27 PM  Result Value Ref Range   Color, Urine YELLOW YELLOW   APPearance CLEAR CLEAR   Specific Gravity, Urine 1.032 (H) 1.005 - 1.030   pH 5.0 5.0 - 8.0   Glucose, UA NEGATIVE NEGATIVE mg/dL   Hgb urine dipstick NEGATIVE NEGATIVE    Bilirubin Urine NEGATIVE NEGATIVE   Ketones, ur 5 (A) NEGATIVE mg/dL   Protein, ur NEGATIVE NEGATIVE mg/dL   Nitrite NEGATIVE NEGATIVE   Leukocytes,Ua NEGATIVE NEGATIVE    Comment: Performed at [facility] [address], [place], [place] [address]  SARS Coronavirus 2 by RT PCR (hospital order, performed in [facility] hospital lab) Nasopharyngeal Nasopharyngeal Swab     Status: None   Collection Time: 03/16/20  8:56 PM   Specimen: Nasopharyngeal Swab  Result Value Ref Range   SARS Coronavirus 2 NEGATIVE NEGATIVE    Comment: (NOTE) SARS-CoV-2 target nucleic acids are NOT DETECTED.  The SARS-CoV-2 RNA is generally detectable in upper and lower respiratory specimens during the acute phase of infection. The lowest concentration of SARS-CoV-2 viral copies this assay can detect is 250 copies / mL. A negative result does not preclude SARS-CoV-2 infection and should not be used as the sole basis for treatment or other patient management decisions.  A negative result may occur with improper specimen collection / handling, submission of specimen other than nasopharyngeal swab, presence of viral mutation(s) within the areas targeted by this assay, and inadequate number of viral copies (<250 copies / mL). A negative result must be combined with clinical observations, patient history, and epidemiological information.  Fact Sheet for Patients:   StrictlyIdeas.no  Fact Sheet for Healthcare Providers: BankingDealers.co.za  This test is not yet approved or  cleared by the [place] FDA and has been authorized for detection and/or diagnosis of SARS-CoV-2 by FDA under an Emergency Use Authorization (EUA).  This EUA will remain in effect (meaning this test can be used) for the duration of the COVID-19 declaration under Section 564(b)(1) of the Act, 21 U.S.C. section 360bbb-3(b)(1), unless the authorization is terminated or revoked  sooner.  Performed at [facility], [place] [address], [place], [place] [address]     Blood Alcohol level:  Lab Results  Component Value Date   ETH 221 (H) 16/09/930    Metabolic Disorder Labs:  No results found for: HGBA1C, MPG No results found for: PROLACTIN No results found for: CHOL, TRIG, HDL, CHOLHDL, VLDL, LDLCALC  Current Medications: Current Facility-Administered Medications  Medication Dose Route Frequency Provider Last Rate Last Admin  . acetaminophen (TYLENOL) tablet 650 mg  650 mg Oral Q6H PRN Rozetta Nunnery, NP      . alum & mag hydroxide-simeth (MAALOX/MYLANTA) 200-200-20 MG/5ML suspension 30 mL  30 mL Oral Q4H PRN Rozetta Nunnery, NP      . Derrill Memo ON 03/18/2020] citalopram (CELEXA) tablet 10 mg  10 mg Oral Daily Mileigh Tilley A, MD      . hydrOXYzine (ATARAX/VISTARIL) tablet 25 mg  25 mg Oral Q6H PRN Lindon Romp A, NP      . loperamide (IMODIUM) capsule 2-4 mg  2-4 mg Oral PRN Lindon Romp A, NP      . LORazepam (ATIVAN) tablet 1 mg  1 mg Oral Q6H PRN Rozetta Nunnery, NP      .  LORazepam (ATIVAN) tablet 1 mg  1 mg Oral QID Lindon Romp A, NP   1 mg at 03/17/20 1914   Followed by  . [START ON 03/18/2020] LORazepam (ATIVAN) tablet 1 mg  1 mg Oral TID Rozetta Nunnery, NP       Followed by  . [START ON 03/19/2020] LORazepam (ATIVAN) tablet 1 mg  1 mg Oral BID Rozetta Nunnery, NP       Followed by  . [START ON 03/20/2020] LORazepam (ATIVAN) tablet 1 mg  1 mg Oral Daily Lindon Romp A, NP      . magnesium hydroxide (MILK OF MAGNESIA) suspension 30 mL  30 mL Oral Daily PRN Lindon Romp A, NP      . multivitamin with minerals tablet 1 tablet  1 tablet Oral Daily Lindon Romp A, NP   1 tablet at 03/17/20 1402  . ondansetron (ZOFRAN-ODT) disintegrating tablet 4 mg  4 mg Oral Q6H PRN Rozetta Nunnery, NP      . Derrill Memo ON 03/18/2020] thiamine tablet 100 mg  100 mg Oral Daily Lindon Romp A, NP       PTA Medications: Medications Prior to Admission  Medication Sig Dispense  Refill Last Dose  . melatonin 1 MG TABS tablet Take 3 mg by mouth at bedtime as needed (sleep).   Past Week at Unknown time  . nicotine (NICODERM CQ - DOSED IN MG/24 HOURS) 14 mg/24hr patch Place 14 mg onto the skin daily.   Past Week at Unknown time    Musculoskeletal: Strength & Muscle Tone: within normal limits Gait & Station: normal Patient leans: N/A  Psychiatric Specialty Exam: Physical Exam  Review of Systems  Blood pressure 124/85, pulse 70, temperature 98 F (36.7 C), temperature source Oral, resp. rate 18, height '6\' 1"'  (1.854 m), weight 108.9 kg, SpO2 96 %.Body mass index is 31.66 kg/m.  General Appearance: Neat  Eye Contact:  Good  Speech:  Normal Rate  Volume:  Normal  Mood:  Depressed  Affect:  Appropriate  Thought Process:  Linear and Descriptions of Associations: Intact  Orientation:  Full (Time, Place, and Person)  Thought Content:  WDL  Suicidal Thoughts:  No  Homicidal Thoughts:  No  Memory:  Recent;   Good  Judgement:  Fair  Insight:  Fair  Psychomotor Activity:  Normal  Concentration:  Concentration: Good  Recall:  Good  Fund of Knowledge:  Good  Language:  Good  Akathisia:  NA  Handed:  Right  AIMS (if indicated):     Assets:  Communication Skills Desire for Improvement Resilience Social Support  ADL's:  Intact  Cognition:  WNL  Sleep:      Diagnosis:  D/D: 1. Major Depressive disorder 2. Alcohol induced mood disorder  Treatment Plan Summary: Daily contact with patient to assess and evaluate symptoms and progress in treatment .  Plan:  1. Start Celexa 10 mg Q DAY PO for depression 2. Alcohol detoxification protocol activated to prevent alcohol withdrawal symptoms.  Observation Level/Precautions:  Continuous Observation  Laboratory:  NA  Psychotherapy:    Medications:    Consultations:    Discharge Concerns:    Estimated LOS:  Other:     Physician Treatment Plan for Primary Diagnosis: <principal problem not specified> Long Term  Goal(s): Improvement in symptoms so as ready for discharge  Short Term Goals: Ability to identify changes in lifestyle to reduce recurrence of condition will improve, Ability to verbalize feelings will improve, Ability to disclose and discuss  suicidal ideas, Ability to demonstrate self-control will improve, Ability to identify and develop effective coping behaviors will improve and Compliance with prescribed medications will improve  Physician Treatment Plan for Secondary Diagnosis: Active Problems:   Severe recurrent major depression without psychotic features ([place])   Alcohol dependence with alcohol-induced mood disorder ([place])  Long Term Goal(s): Improvement in symptoms so as ready for discharge  Short Term Goals: Ability to verbalize feelings will improve, Ability to disclose and discuss suicidal ideas and Compliance with prescribed medications will improve  I certify that inpatient services furnished can reasonably be expected to improve the patient's condition.    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 Was taking Melatonin PRN prior to admission, no other medications. Presented to ED via EMS who had been contacted by family. He states he was experiencing suicidal ideations and on day of admission he had attempted to hang himself with  an electrical cord but it had snapped . He states he believes he briefly lost consciousness as he woke up on the ground. He sustained scalp laceration ( occipital area) which was sutured . ( Head /Spine CT were negative for acute trauma or fx)  He reports he has been experiencing depression and anxiety over recent months/year. He does not identify any specific triggers that may be causing his mood symptoms, but he feels depressive symptoms have been worsening. He states that on day of admission he had been  drinking and had an argument with his child's mother earlier, and states " I think the alcohol just made me act irrationally". Reports that over recent weeks he had been experiencing intermittent suicidal ideations. Endorses neuro-vegetative symptoms- poor energy level, decreased appetite, anhedonia. States he has been sleeping "OK". Denies psychotic symptoms. He reports regular, frequently daily, alcohol consumption and has been drinking daily and heavily ( mainly Vodka) which he states he was sometimes putting into a water bottle to hide use from family members. Denies drug abuse. Admission BAL 221, UDS negative. History of one prior psychiatric admission 20 years ago related to suicidal attempt by overdosing at the time. He describes history of depression which he characterizes as intermittent.Denies history of mania or hypomania, denies history of psychosis.  Denies history of severe alcohol WDL symptoms, denies history of seizures or DTs. Denies medical illnesses, NKDA. Smokes 1 PPD  Paternal grandfather had history of alcohol dependence.  Dx- MDD versus Alcohol Induced Mood Disorder, Alcohol Use Disorder, S/P Suicide Attempt  Plan- Inpatient admission, Alcohol Detox Protocol to address alcohol WDL ( Ativan taper) , thiamine and folate supplementation. Patient reports he does feel he has significant depression independently from alcohol use and is interested in an antidepressant trial. Start Celexa 10 mgrs QDAY initially. Side effects including risk of sexual dysfunction reviewed

## 2020-03-17 NOTE — ED Notes (Signed)
MD notified of pt's acceptance to [facility]. [employer] called for transportation.

## 2020-03-17 NOTE — ED Notes (Signed)
Attempted report to [facility] x2 with no answer

## 2020-03-17 NOTE — ED Notes (Signed)
Pt has been accepted to [facility] and can arrive at any time. This information was provided to pt's nurse, Monique RN, at [phone].  Room: 301-2 Accepting: Jason Berry, NP Attending: Dr. Clary Call to Report: [phone] 

## 2020-03-17 NOTE — ED Notes (Signed)
Patient has been IVC'D--William Mccoy  

## 2020-03-17 NOTE — ED Notes (Signed)
Still unable to give report to [facility]

## 2020-03-17 NOTE — Tx Team (Signed)
Initial Treatment Plan 03/17/2020 11:51 AM William Mccoy [id number]    PATIENT STRESSORS: Financial difficulties Health problems Marital or family conflict Substance abuse   PATIENT STRENGTHS: Ability for insight Average or above average intelligence Capable of independent living Motivation for treatment/growth   PATIENT IDENTIFIED PROBLEMS: "To get some rest"  "To start thinking straight"  Suicidal ideation  Depression  Alcohol Abuse             DISCHARGE CRITERIA:  Ability to meet basic life and health needs Adequate post-discharge living arrangements Motivation to continue treatment in a less acute level of care  PRELIMINARY DISCHARGE PLAN: Attend aftercare/continuing care group Outpatient therapy Return to previous living arrangement  PATIENT/FAMILY INVOLVEMENT: This treatment plan has been presented to and reviewed with the patient, William Mccoy, and/or family member.  The patient and family have been given the opportunity to ask questions and make suggestions.  William Critchley, RN 03/17/2020, 11:51 AM

## 2020-03-17 NOTE — [facility] Suicide Risk Assessment (Signed)
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  World Science writer [facility]). Score between 0-7:  no or low risk or alcohol related problems. Score between 8-15:  moderate risk of alcohol related problems. Score between 16-19:  high risk of alcohol related problems. Score 20 or above:  warrants further diagnostic evaluation for alcohol dependence and treatment.   CLINICAL FACTORS:  46, married , has a 26 y old child, self employed Multimedia programmer)  . Was taking Melatonin PRN prior to admission, no other medications. Presented to ED via EMS who had been contacted by family. He states he was experiencing suicidal ideations and on day of admission he had attempted to hang himself with  an electrical cord but it had snapped . He states he believes he briefly lost consciousness as he woke up on the ground. He sustained scalp laceration ( occipital area) which was sutured . ( Head /Spine CT were negative for acute trauma or fx)  He reports he has been experiencing depression and anxiety over recent months/year. He does not identify any specific triggers that may be causing his mood symptoms, but he feels depressive symptoms have been worsening. He states that on day of admission  he had been drinking and had an argument with his child's mother earlier, and states " I think the alcohol just made me act irrationally". Reports that over recent weeks he had been experiencing intermittent suicidal ideations. Endorses neuro-vegetative symptoms- poor energy level, decreased appetite, anhedonia. States he has been sleeping "OK". Denies psychotic symptoms. He reports regular, frequently daily, alcohol consumption and has been drinking daily and heavily ( mainly Vodka) which he states he was sometimes putting into a water bottle to hide use from family members. Denies drug abuse. Admission BAL 221, UDS negative. History of one prior psychiatric admission 20 years ago related to suicidal attempt by overdosing at the time. He describes history of depression which he characterizes as intermittent.Denies history of mania or hypomania, denies history of psychosis.  Denies history of severe alcohol WDL symptoms, denies history of seizures or DTs. Denies medical illnesses, NKDA. Smokes 1 PPD  Paternal grandfather had history of alcohol dependence.  Dx- MDD versus Alcohol Induced Mood Disorder, Alcohol Use Disorder, S/P Suicide Attempt  Plan- Inpatient admission, Alcohol Detox Protocol to address alcohol WDL ( Ativan taper) , thiamine and folate supplementation. Patient reports he does feel he has significant depression independently from alcohol use and is interested in an antidepressant trial. Start Celexa 10 mgrs QDAY initially. Side effects including risk of sexual dysfunction reviewed .   Musculoskeletal: Strength & Muscle Tone: within normal limits Gait & Station: normal Patient leans: N/A  Psychiatric Specialty Exam: Physical Exam  Review of Systems reports headache, denies ocular disturbances , reports some odynophagia, no dyspnea, no chest pain, no abdominal pain, no vomiting, no rash, no fever, no chills  Blood pressure 124/85, pulse 70, temperature 98 F (36.7 C),  temperature source Oral, resp. rate 18, height 6\' 1"  (1.854 m), weight 108.9 kg, SpO2 96 %.Body mass index is 31.66 kg/m.  General Appearance: Fairly Groomed  Eye Contact:  Good  Speech:  Normal Rate  Volume:  Normal  Mood:  Depressed  Affect:  Appropriate  Thought Process:  Linear and Descriptions of Associations: Intact  Orientation:  Full (Time, Place, and Person)  Thought Content:  denies hallucinations, no delusions, not internally preoccupied   Suicidal Thoughts:  No denies current suicidal or self injurious ideations and contracts for safety, denies homicidal or violent ideations  Homicidal Thoughts:  No  Memory:  recent and remote grossly intact   Judgement:  Fair  Insight:  Fair  Psychomotor Activity:  Normal- no diaphoresis, no restlessness, no tremors or dipahoresis  Concentration:  Concentration: Good and Attention Span: Good  Recall:  Good  Fund of Knowledge:  Good  Language:  Good  Akathisia:  Negative  Handed:  Right  AIMS (if indicated):     Assets:  Communication Skills Desire for Improvement Resilience  ADL's:  Intact  Cognition:  WNL  Sleep:         COGNITIVE FEATURES THAT CONTRIBUTE TO RISK:  Closed-mindedness, Loss of executive function and Polarized thinking    SUICIDE RISK:   Moderate:  Frequent suicidal ideation with limited intensity, and duration, some specificity in terms of plans, no associated intent, good self-control, limited dysphoria/symptomatology, some risk factors present, and identifiable protective factors, including available and accessible social support.  PLAN OF CARE: Patient will be admitted to inpatient psychiatric unit for stabilization and safety. Will provide and encourage milieu participation. Provide medication management and maked adjustments as needed. Will also provide medication management to address alcohol WDL as needed . Will follow daily.    I certify that inpatient services furnished can reasonably be expected to  improve the patient's condition.   , MD 03/17/2020, 5:21 PM

## 2020-03-18 LAB — LIPID PANEL
Cholesterol: 214 mg/dL — ABNORMAL HIGH (ref 0–200)
HDL: 35 mg/dL — ABNORMAL LOW (ref >40)
LDL Cholesterol: 120 mg/dL — ABNORMAL HIGH (ref 0–99)
Total CHOL/HDL Ratio: 6.1 RATIO
Triglycerides: 296 mg/dL — ABNORMAL HIGH (ref <150)
VLDL: 59 mg/dL — ABNORMAL HIGH (ref 0–40)

## 2020-03-18 LAB — HEMOGLOBIN A1C
Hgb A1c MFr Bld: 5.1 % (ref 4.8–5.6)
Mean Plasma Glucose: 99.67 mg/dL

## 2020-03-18 LAB — TSH: TSH: 4.061 u[IU]/mL (ref 0.350–4.500)

## 2020-03-18 MED ORDER — OMEGA-3-ACID ETHYL ESTERS 1 G PO CAPS
1.0000 g | ORAL_CAPSULE | Freq: Two times a day (BID) | ORAL | Status: DC
  Administered 2020-03-18 – 2020-03-25 (×14): 1 g via ORAL
  Filled 2020-03-18: qty 1
  Filled 2020-03-18: qty 14
  Filled 2020-03-18 (×8): qty 1
  Filled 2020-03-18: qty 14
  Filled 2020-03-18 (×8): qty 1

## 2020-03-18 MED ORDER — CITALOPRAM HYDROBROMIDE 20 MG PO TABS
20.0000 mg | ORAL_TABLET | Freq: Every day | ORAL | Status: DC
  Administered 2020-03-19 – 2020-03-21 (×3): 20 mg via ORAL
  Filled 2020-03-18 (×4): qty 1

## 2020-03-18 MED ORDER — TRAZODONE HCL 50 MG PO TABS
50.0000 mg | ORAL_TABLET | Freq: Every evening | ORAL | Status: DC | PRN
  Administered 2020-03-18 – 2020-03-22 (×5): 50 mg via ORAL
  Filled 2020-03-18 (×5): qty 1

## 2020-03-18 NOTE — Progress Notes (Signed)
D. Pt presents with a flat affect/ depressed mood- calm, cooperative behavior- observed in the milieu interacting well with peers. Pt denies withdrawal symptoms, SI/HI and A/VH. A. Labs and vitals monitored. Pt supported emotionally and encouraged to express concerns and ask questions.   R. Pt remains safe with 15 minute checks. Will continue POC.

## 2020-03-18 NOTE — [facility] Group Notes (Signed)
Adult Psychoeducational Group Note  Date:  03/18/2020 Time:  11:42 PM  Group Topic/Focus:  Wrap-Up Group:   The focus of this group is to help patients review their daily goal of treatment and discuss progress on daily workbooks.  Participation Level:  Active  Participation Quality:  Appropriate  Affect:  Appropriate  Cognitive:  Appropriate  Insight: Appropriate  Engagement in Group:  Engaged  Modes of Intervention:  Discussion  Additional Comments:  Pt stated he had a good day.  He was able to stay awake and engage with the other patients.  Pt stated he had a better than average day and rated it at a 6/10.  William Mccoy 03/18/2020, 11:42 PM

## 2020-03-18 NOTE — Progress Notes (Signed)
[facility] MD Progress Note  03/18/2020 2:38 PM William Mccoy  MRN:  [id number] Subjective: patient reports that he is feeling better today. He states he remains depressed and continues to endorse a vague sense of anxiety/apprehension. Today denies suicidal ideations. Objective : I have reviewed chart notes and have met with patient. 39 year old married male, presented to ED following suicide attempt by hanging self. Reportedly the cord snapped and he fell during suicide attempt, resulting in scalp laceration and brief loss of consciousness . He reports history of depression and states he had been experiencing suicidal ideations prior to event , but reports attempt was impulsive and in the context of alcohol intoxication. He was drinking regularly. Admission BAL 221.   At this time denies symptoms of alcohol WDL and does not appear to be in any acute distress- no tremors, no diaphoresis, vitals are stable ( 129/78, pulse 73) . Labs reviewed - cholesterol 215, triglycerides 296. HgbA1C 5.1  Today reports persistent depression but states he is feeling better and currently denies suicidal ideations/contracts for safety on unit. Denies medication side effects- tolerating Ativan taper ( for alcohol detox) well and was started on Celexa 10 mgrs QDAY on admission, which he has tolerated well thus far.   Principal Problem: MDD versus alcohol induced mood disorder , alcohol use disorder Diagnosis: Active Problems:   Severe recurrent major depression without psychotic features (HCC)   Alcohol dependence with alcohol-induced mood disorder ([place])  Total Time spent with patient: 15 minutes  Past Psychiatric History:  Past Medical History: History reviewed. No pertinent past medical history. History reviewed. No pertinent surgical history. Family History: History reviewed. No pertinent family history. Family Psychiatric  History:  Social History:  Social History   Substance and Sexual Activity  Alcohol Use Yes   . Alcohol/week: 6.0 standard drinks  . Types: 6 Shots of liquor per week   Comment: every day     Social History   Substance and Sexual Activity  Drug Use Never    Social History   Socioeconomic History  . Marital status: Single    Spouse name: Not on file  . Number of children: Not on file  . Years of education: Not on file  . Highest education level: Not on file  Occupational History  . Not on file  Tobacco Use  . Smoking status: Current Every Day Smoker    Packs/day: 1.00    Types: Cigarettes  . Smokeless tobacco: Never Used  Substance and Sexual Activity  . Alcohol use: Yes    Alcohol/week: 6.0 standard drinks    Types: 6 Shots of liquor per week    Comment: every day  . Drug use: Never  . Sexual activity: Yes  Other Topics Concern  . Not on file  Social History Narrative  . Not on file   Social Determinants of Health   Financial Resource Strain:   . Difficulty of Paying Living Expenses:   Food Insecurity:   . Worried About Charity fundraiser in the Last Year:   . Arboriculturist in the Last Year:   Transportation Needs:   . Film/video editor (Medical):   Marland Kitchen Lack of Transportation (Non-Medical):   Physical Activity:   . Days of Exercise per Week:   . Minutes of Exercise per Session:   Stress:   . Feeling of Stress :   Social Connections:   . Frequency of Communication with Friends and Family:   . Frequency of  Social Gatherings with Friends and Family:   . Attends Religious Services:   . Active Member of Clubs or Organizations:   . Attends Archivist Meetings:   Marland Kitchen Marital Status:    Additional Social History:   Sleep: Fair  Appetite:  Fair  Current Medications: Current Facility-Administered Medications  Medication Dose Route Frequency Provider Last Rate Last Admin  . acetaminophen (TYLENOL) tablet 650 mg  650 mg Oral Q6H PRN Lindon Romp A, NP   650 mg at 03/18/20 1248  . alum & mag hydroxide-simeth (MAALOX/MYLANTA) 200-200-20  MG/5ML suspension 30 mL  30 mL Oral Q4H PRN Lindon Romp A, NP      . citalopram (CELEXA) tablet 10 mg  10 mg Oral Daily Nilam Quakenbush, Myer Peer, MD   10 mg at 03/18/20 0900  . hydrOXYzine (ATARAX/VISTARIL) tablet 25 mg  25 mg Oral Q6H PRN Lindon Romp A, NP      . loperamide (IMODIUM) capsule 2-4 mg  2-4 mg Oral PRN Lindon Romp A, NP      . LORazepam (ATIVAN) tablet 1 mg  1 mg Oral Q6H PRN Lindon Romp A, NP      . LORazepam (ATIVAN) tablet 1 mg  1 mg Oral TID Lindon Romp A, NP   1 mg at 03/18/20 1249   Followed by  . [START ON 03/19/2020] LORazepam (ATIVAN) tablet 1 mg  1 mg Oral BID Rozetta Nunnery, NP       Followed by  . [START ON 03/20/2020] LORazepam (ATIVAN) tablet 1 mg  1 mg Oral Daily Lindon Romp A, NP      . magnesium hydroxide (MILK OF MAGNESIA) suspension 30 mL  30 mL Oral Daily PRN Lindon Romp A, NP      . multivitamin with minerals tablet 1 tablet  1 tablet Oral Daily Lindon Romp A, NP   1 tablet at 03/18/20 0900  . ondansetron (ZOFRAN-ODT) disintegrating tablet 4 mg  4 mg Oral Q6H PRN Lindon Romp A, NP      . thiamine tablet 100 mg  100 mg Oral Daily Lindon Romp A, NP   100 mg at 03/18/20 0900    Lab Results:  Results for orders placed or performed during the hospital encounter of 03/17/20 (from the past 48 hour(s))  Hemoglobin A1c     Status: None   Collection Time: 03/18/20  7:03 AM  Result Value Ref Range   Hgb A1c MFr Bld 5.1 4.8 - 5.6 %    Comment: (NOTE) Pre diabetes:          5.7%-6.4%  Diabetes:              >6.4%  Glycemic control for   <7.0% adults with diabetes    Mean Plasma Glucose 99.67 mg/dL    Comment: Performed at [facility], [place] [address] [place], [place] [address]  Lipid panel     Status: Abnormal   Collection Time: 03/18/20  7:03 AM  Result Value Ref Range   Cholesterol 214 (H) 0 - 200 mg/dL   Triglycerides 296 (H) <150 mg/dL   HDL 35 (L) >40 mg/dL   Total CHOL/HDL Ratio 6.1 RATIO   VLDL 59 (H) 0 - 40 mg/dL   LDL Cholesterol 120 (H)  0 - 99 mg/dL    Comment:        Total Cholesterol/HDL:CHD Risk Coronary Heart Disease Risk Table  Men   Women  1/2 Average Risk   3.4   3.3  Average Risk       5.0   4.4  2 X Average Risk   9.6   7.1  3 X Average Risk  23.4   11.0        Use the calculated Patient Ratio above and the CHD Risk Table to determine the patient's CHD Risk.        ATP III CLASSIFICATION (LDL):  <100     mg/dL   Optimal  100-129  mg/dL   Near or Above                    Optimal  130-159  mg/dL   Borderline  160-189  mg/dL   High  >190     mg/dL   Very High Performed at [facility] [address] [place], [place] [address]   TSH     Status: None   Collection Time: 03/18/20  7:03 AM  Result Value Ref Range   TSH 4.061 0.350 - 4.500 uIU/mL    Comment: Performed by a 3rd Generation assay with a functional sensitivity of <=0.01 uIU/mL. Performed at [facility], [place] [address], [place], [place] [address]     Blood Alcohol level:  Lab Results  Component Value Date   ETH 221 (H) 22/97/9892    Metabolic Disorder Labs: Lab Results  Component Value Date   HGBA1C 5.1 03/18/2020   MPG 99.67 03/18/2020   No results found for: PROLACTIN Lab Results  Component Value Date   CHOL 214 (H) 03/18/2020   TRIG 296 (H) 03/18/2020   HDL 35 (L) 03/18/2020   CHOLHDL 6.1 03/18/2020   VLDL 59 (H) 03/18/2020   LDLCALC 120 (H) 03/18/2020    Physical Findings: AIMS:  , ,  ,  ,    CIWA:  CIWA-Ar Total: 4 COWS:     Musculoskeletal: Strength & Muscle Tone: within normal limits no tremors, no diaphoresis, no restlessness or agitation Gait & Station: normal Patient leans: N/A  Psychiatric Specialty Exam: Physical Exam  Review of Systems reports mild headache, some residual odynophagia, no hoarseness noted at this time, no shortness of breath at room air .  Blood pressure 129/78, pulse 73, temperature 98 F (36.7 C), temperature source Oral, resp.  rate 18, height 6' 1" (1.854 m), weight 108.9 kg, SpO2 96 %.Body mass index is 31.66 kg/m.  General Appearance: Fairly Groomed  Eye Contact:  Fair, improves during session  Speech:  Normal Rate  Volume:  Normal  Mood:  mood is reported as still depressed, describes as 5/10, does acknowledge some improvement  Affect:  constricted, does smile at times appropriately  Thought Process:  Linear and Descriptions of Associations: Intact  Orientation:  Full (Time, Place, and Person)  Thought Content:  denies hallucinations, no delusions, not internally preoccupied   Suicidal Thoughts:  No today denies suicidal ideations and currently contracts for safety on unit   Homicidal Thoughts:  No  Memory:  recent and remote grossly intact   Judgement:  Fair/ improving   Insight:  Fair  Psychomotor Activity:  Normal  Concentration:  Concentration: Good and Attention Span: Good  Recall:  Good  Fund of Knowledge:  Good  Language:  Good  Akathisia:  Negative  Handed:  Right  AIMS (if indicated):     Assets:  Communication Skills Desire for Improvement Resilience  ADL's:  Intact  Cognition:  WNL  Sleep:  Number of Hours: 6.75    Assessment -  39 year old married male, presented to ED following suicide attempt by hanging self. Reportedly the cord snapped and he fell during suicide attempt, resulting in scalp laceration and brief loss of consciousness . He reports history of depression and states he had been experiencing suicidal ideations prior to event , but reports attempt was impulsive and in the context of alcohol intoxication. He was drinking regularly. Admission BAL 221.  Patient reports some improvement compared to how he was feeling prior to admission but continues to endorse chronic depression and anxiety. Denies suicidal ideations today and contracts for safety. Endorses some odynophagia following hanging attempt but not other associated symptoms and does not endorse dysphagia or shortness of  breath. No current symptoms of alcohol WDL and vitals are stable. Tolerating Celexa trial well thus far .    Treatment Plan Summary: Daily contact with patient to assess and evaluate symptoms and progress in treatment, Medication management, Plan inpatient treatment  and medications as below Encourage group and milieu participation Encourage efforts to work on sobriety and relapse prevention Treatment team working on disposition planning  Continue Ativan taper for alcohol WDL Continue Thiamine and Folate supplementation Increase Celexa to 20 mgrs QDAY for depression and anxiety  Jenne Campus, MD 03/18/2020, 2:38 PM

## 2020-03-18 NOTE — [facility] Group Notes (Signed)
Adult Psychoeducational Group Note  Date:  03/18/2020 Time:  2:48 PM  Group Topic/Focus:  Identifying Needs:   The focus of this group is to help patients identify their personal needs that have been historically problematic and identify healthy behaviors to address their needs.  Participation Level:  Minimal  Participation Quality:  Appropriate  Affect:  Blunted  Cognitive:  Oriented  Insight: Lacking  Engagement in Group:  Engaged  Modes of Intervention:  Discussion, Education and Role-play  Additional Comments:  Paid attention throughout, although didn't interact  A lot.  Dione Housekeeper 03/18/2020, 2:48 PM

## 2020-03-18 NOTE — Progress Notes (Signed)
   03/18/20 2213  Psych Admission Type (Psych Patients Only)  Admission Status Involuntary  Psychosocial Assessment  Patient Complaints Isolation  Eye Contact Fair  Facial Expression Flat  Affect Appropriate to circumstance  Speech Logical/coherent  Interaction Assertive  Motor Activity Other (Comment) (WDL)  Appearance/Hygiene Disheveled  Behavior Characteristics Appropriate to situation  Mood Pleasant  Thought Process  Coherency WDL  Content WDL  Delusions None reported or observed  Perception WDL  Hallucination None reported or observed  Judgment Impaired  Confusion None  Danger to Self  Current suicidal ideation? Denies  Self-Injurious Behavior No self-injurious ideation or behavior indicators observed or expressed   Agreement Not to Harm Self No  Description of Agreement verbal  Danger to Others  Danger to Others None reported or observed

## 2020-03-18 NOTE — Progress Notes (Signed)
[place] NOVEL CORONAVIRUS (COVID-19) DAILY CHECK-OFF SYMPTOMS - answer yes or no to each - every day NO YES  Have you had a fever in the past 24 hours?  . Fever (Temp > 37.80C / 100F) X   Have you had any of these symptoms in the past 24 hours? . New Cough .  Sore Throat  .  Shortness of Breath .  Difficulty Breathing .  Unexplained Body Aches   X   Have you had any one of these symptoms in the past 24 hours not related to allergies?   . Runny Nose .  Nasal Congestion .  Sneezing   X   If you have had runny nose, nasal congestion, sneezing in the past 24 hours, has it worsened?  X   EXPOSURES - check yes or no X   Have you traveled outside the state in the past 14 days?  X   Have you been in contact with someone with a confirmed diagnosis of COVID-19 or PUI in the past 14 days without wearing appropriate PPE?  X   Have you been living in the same home as a person with confirmed diagnosis of COVID-19 or a PUI (household contact)?    X   Have you been diagnosed with COVID-19?    X              What to do next: Answered NO to all: Answered YES to anything:   Proceed with unit schedule Follow the [facility] Inpatient Flowsheet.   

## 2020-03-18 NOTE — Progress Notes (Signed)
   03/18/20 2209  COVID-19 Daily Checkoff  Have you had a fever (temp > 37.80C/100F)  in the past 24 hours?  No  If you have had runny nose, nasal congestion, sneezing in the past 24 hours, has it worsened? No  COVID-19 EXPOSURE  Have you traveled outside the state in the past 14 days? No  Have you been in contact with someone with a confirmed diagnosis of COVID-19 or PUI in the past 14 days without wearing appropriate PPE? No  Have you been living in the same home as a person with confirmed diagnosis of COVID-19 or a PUI (household contact)? No  Have you been diagnosed with COVID-19? No

## 2020-03-18 NOTE — [facility] Group Notes (Signed)
Adult Psychoeducational Group Note  Date:  03/18/2020 Time:  10:04 AM  Group Topic/Focus:  Goals Group:   The focus of this group is to help patients establish daily goals to achieve during treatment and discuss how the patient can incorporate goal setting into their daily lives to aide in recovery.  Participation Level:  Did Not Attend  Dione Housekeeper 03/18/2020, 10:04 AM

## 2020-03-19 MED ORDER — MENTHOL 3 MG MT LOZG
1.0000 | LOZENGE | OROMUCOSAL | Status: DC | PRN
  Administered 2020-03-20 (×2): 3 mg via ORAL
  Filled 2020-03-19: qty 9

## 2020-03-19 MED ORDER — IBUPROFEN 400 MG PO TABS
400.0000 mg | ORAL_TABLET | Freq: Four times a day (QID) | ORAL | Status: DC | PRN
  Administered 2020-03-19 – 2020-03-20 (×2): 400 mg via ORAL
  Filled 2020-03-19 (×2): qty 1

## 2020-03-19 NOTE — [facility] Group Notes (Signed)
Adult Psychoeducational Group Note  Date:  03/19/2020 Time:  12:52 PM  Group Topic/Focus:   Group Topic/Focus: PROGRESSIVE RELAXATION. A group where deep breathing is taught and tensing and relaxation muscle groups is used. Imagery is used as well.  Pts are asked to imagine 3 pillars that hold them up when they are not able to hold themselves up.   Participation Level:  Did Not Attend   Dione Housekeeper 03/19/2020, 12:52 PM

## 2020-03-19 NOTE — Progress Notes (Signed)
[facility] MD Progress Note  03/19/2020 10:27 AM William Mccoy  MRN:  [id number] Subjective:  Patient reports that his mood is good today. On the scale of 1-10, 10 being severely depressed, he placed his mood at 2. He states he does not have any suicidal ideations today. Also, added he had suicidal ideations with no plan yesterday but after lunch until this morning he has no suicidal ideation. He states going out of his room, talking with other people and attending group therapy helped him to feel better. He denies homicidal ideation or any auditory/visual hallucinations. He complains of bad headache that is preventing him to sleep well and he stated he feels tired because of that. He reports he has normal appetite but is little cautious while eating solid foods because of his throat pain. Denies any anxiety/apprehension today.  Objective : Patient is seen and examined. He is a 39 year old married male, presented to ED following suicide attempt by hanging self. Reportedly the cord snapped and he fell during suicide attempt, resulting in scalp laceration and brief loss of consciousness . He reports history of depression and states he had been experiencing suicidal ideations prior to event , but reports attempt was impulsive and in the context of alcohol intoxication. He was drinking regularly. Admission BAL 221.    He was lying in his bed complaining of bad headache. His mood seems fine, with normal rate and volume speech. No withdrawal symptoms were noticed. No self injurious behavior. Vitals are stable and no new labs today. He does not report any side effect from medication so far.  Principal Problem: <principal problem not specified> Diagnosis: Active Problems:   Severe recurrent major depression without psychotic features (HCC)   Alcohol dependence with alcohol-induced mood disorder (HCC)  Total Time spent with patient: 20 minutes  Past Psychiatric History: He has one suicide attempt 20 years ago by  overdose.  Past Medical History: History reviewed. No pertinent past medical history. History reviewed. No pertinent surgical history. Family History: History reviewed. No pertinent family history. Family Psychiatric  History: NA Social History:  Social History   Substance and Sexual Activity  Alcohol Use Yes  . Alcohol/week: 6.0 standard drinks  . Types: 6 Shots of liquor per week   Comment: every day     Social History   Substance and Sexual Activity  Drug Use Never    Social History   Socioeconomic History  . Marital status: Single    Spouse name: Not on file  . Number of children: Not on file  . Years of education: Not on file  . Highest education level: Not on file  Occupational History  . Not on file  Tobacco Use  . Smoking status: Current Every Day Smoker    Packs/day: 1.00    Types: Cigarettes  . Smokeless tobacco: Never Used  Substance and Sexual Activity  . Alcohol use: Yes    Alcohol/week: 6.0 standard drinks    Types: 6 Shots of liquor per week    Comment: every day  . Drug use: Never  . Sexual activity: Yes  Other Topics Concern  . Not on file  Social History Narrative  . Not on file   Social Determinants of Health   Financial Resource Strain:   . Difficulty of Paying Living Expenses:   Food Insecurity:   . Worried About Programme researcher, broadcasting/film/video in the Last Year:   . Barista in the Last Year:   [employer]  Needs:   . Lack of Transportation (Medical):   Marland Kitchen Lack of Transportation (Non-Medical):   Physical Activity:   . Days of Exercise per Week:   . Minutes of Exercise per Session:   Stress:   . Feeling of Stress :   Social Connections:   . Frequency of Communication with Friends and Family:   . Frequency of Social Gatherings with Friends and Family:   . Attends Religious Services:   . Active Member of Clubs or Organizations:   . Attends Banker Meetings:   Marland Kitchen Marital Status:    Additional Social History:                          Sleep: Fair  Appetite:  Good  Current Medications: Current Facility-Administered Medications  Medication Dose Route Frequency Provider Last Rate Last Admin  . acetaminophen (TYLENOL) tablet 650 mg  650 mg Oral Q6H PRN Nira Conn A, NP   650 mg at 03/18/20 1248  . citalopram (CELEXA) tablet 20 mg  20 mg Oral Daily Cobos, Rockey Situ, MD   20 mg at 03/19/20 1014  . hydrOXYzine (ATARAX/VISTARIL) tablet 25 mg  25 mg Oral Q6H PRN Nira Conn A, NP   25 mg at 03/18/20 2119  . LORazepam (ATIVAN) tablet 1 mg  1 mg Oral Q6H PRN Nira Conn A, NP   1 mg at 03/18/20 2119  . LORazepam (ATIVAN) tablet 1 mg  1 mg Oral BID Nira Conn A, NP   1 mg at 03/19/20 1013   Followed by  . [START ON 03/20/2020] LORazepam (ATIVAN) tablet 1 mg  1 mg Oral Daily Nira Conn A, NP      . multivitamin with minerals tablet 1 tablet  1 tablet Oral Daily Nira Conn A, NP   1 tablet at 03/19/20 1012  . omega-3 acid ethyl esters (LOVAZA) capsule 1 g  1 g Oral BID Cobos, Rockey Situ, MD   1 g at 03/19/20 1012  . ondansetron (ZOFRAN-ODT) disintegrating tablet 4 mg  4 mg Oral Q6H PRN Nira Conn A, NP      . thiamine tablet 100 mg  100 mg Oral Daily Nira Conn A, NP   100 mg at 03/19/20 1012  . traZODone (DESYREL) tablet 50 mg  50 mg Oral QHS PRN Karsten Ro, MD   50 mg at 03/18/20 2121    Lab Results:  Results for orders placed or performed during the hospital encounter of 03/17/20 (from the past 48 hour(s))  Hemoglobin A1c     Status: None   Collection Time: 03/18/20  7:03 AM  Result Value Ref Range   Hgb A1c MFr Bld 5.1 4.8 - 5.6 %    Comment: (NOTE) Pre diabetes:          5.7%-6.4%  Diabetes:              >6.4%  Glycemic control for   <7.0% adults with diabetes    Mean Plasma Glucose 99.67 mg/dL    Comment: Performed at [facility], [address], [place], [place] [address]  Lipid panel     Status: Abnormal   Collection Time: 03/18/20  7:03 AM  Result Value Ref Range    Cholesterol 214 (H) 0 - 200 mg/dL   Triglycerides 889 (H) <150 mg/dL   HDL 35 (L) >16 mg/dL   Total CHOL/HDL Ratio 6.1 RATIO   VLDL 59 (H) 0 - 40 mg/dL  LDL Cholesterol 120 (H) 0 - 99 mg/dL    Comment:        Total Cholesterol/HDL:CHD Risk Coronary Heart Disease Risk Table                     Men   Women  1/2 Average Risk   3.4   3.3  Average Risk       5.0   4.4  2 X Average Risk   9.6   7.1  3 X Average Risk  23.4   11.0        Use the calculated Patient Ratio above and the CHD Risk Table to determine the patient's CHD Risk.        ATP III CLASSIFICATION (LDL):  <100     mg/dL   Optimal  [id number]  mg/dL   Near or Above                    Optimal  130-159  mg/dL   Borderline  [id number]  mg/dL   High  >846>190     mg/dL   Very High Performed at [facility], [address] [place], [place] [address]   TSH     Status: None   Collection Time: 03/18/20  7:03 AM  Result Value Ref Range   TSH 4.061 0.350 - 4.500 uIU/mL    Comment: Performed by a 3rd Generation assay with a functional sensitivity of <=0.01 uIU/mL. Performed at [facility], [address] [place], [place] [address]     Blood Alcohol level:  Lab Results  Component Value Date   ETH 221 (H) 03/16/2020    Metabolic Disorder Labs: Lab Results  Component Value Date   HGBA1C 5.1 03/18/2020   MPG 99.67 03/18/2020   No results found for: PROLACTIN Lab Results  Component Value Date   CHOL 214 (H) 03/18/2020   TRIG 296 (H) 03/18/2020   HDL 35 (L) 03/18/2020   CHOLHDL 6.1 03/18/2020   VLDL 59 (H) 03/18/2020   LDLCALC 120 (H) 03/18/2020    Physical Findings: AIMS: Facial and Oral Movements Muscles of Facial Expression: None, normal Lips and Perioral Area: None, normal Jaw: None, normal Tongue: None, normal,Extremity Movements Upper (arms, wrists, hands, fingers): None, normal Lower (legs, knees, ankles, toes): None, normal, Trunk Movements Neck, shoulders, hips: None,  normal, Overall Severity Severity of abnormal movements (highest score from questions above): None, normal Incapacitation due to abnormal movements: None, normal Patient's awareness of abnormal movements (rate only patient's report): No Awareness, Dental Status Current problems with teeth and/or dentures?: No Does patient usually wear dentures?: No  CIWA:  CIWA-Ar Total: 0 COWS:     Musculoskeletal: Strength & Muscle Tone: within normal limits Gait & Station: normal Patient leans: N/A  Psychiatric Specialty Exam: Physical Exam  Review of Systems  Blood pressure 129/78, pulse 73, temperature 98 F (36.7 C), temperature source Oral, resp. rate 18, height 6\' 1"  (1.854 m), weight 108.9 kg, SpO2 96 %.Body mass index is 31.66 kg/m.  General Appearance: Casual  Eye Contact:  Good  Speech:  Normal Rate  Volume:  Normal  Mood:  Good  Affect:  Appropriate  Thought Process:  Coherent  Orientation:  Normal   Thought Content:  WDL  Suicidal Thoughts:  No  Homicidal Thoughts:  No  Memory:  Immediate;   Good  Judgement:  Good  Insight:  Good  Psychomotor Activity:  Normal  Concentration:  Concentration: Good  Recall:  Good  Fund of Knowledge:  Good  Language:  Good  Akathisia:  Negative  Handed:  Right  AIMS (if indicated):     Assets:  Communication Skills Desire for Improvement Resilience Social Support  ADL's:  Intact  Cognition:  WNL  Sleep:  Number of Hours: 6.75    Assessment:  Patient is a 39 year old married male, presented to ED following suicide attempt by hanging self. Reportedly the cord snapped and he fell during suicide attempt, resulting in scalp laceration and brief loss of consciousness . He reports history of depression and states he had been experiencing suicidal ideations prior to event , but reports attempt was impulsive and in the context of alcohol intoxication. He was drinking regularly. Admission BAL 221.  Patients reports good improvement in his mood  compared to when he presented to the ED. He reports headache but apart from that he feels well overall. No SI/HI or AVH. He is tolerating his anti depressant well.  D/D:  1. Major depressive disorder 2. Alcohol induced mood disorder  Treatment Plan Summary: Daily contact with patient to assess and evaluate symptoms and progress in treatment.  1. Start Motrin 400 mg Q6H PRN for headache. 2. Continue Celexa 20 mg PO daily for depression and anxiety. 3. Encourage group and milieu participation 4. Disposition in progress.   Arnoldo Lenis, MD 03/19/2020, 10:27 AM

## 2020-03-19 NOTE — Progress Notes (Signed)
   03/19/20 2314  COVID-19 Daily Checkoff  Have you had a fever (temp > 37.80C/100F)  in the past 24 hours?  No  If you have had runny nose, nasal congestion, sneezing in the past 24 hours, has it worsened? No  COVID-19 EXPOSURE  Have you traveled outside the state in the past 14 days? No  Have you been in contact with someone with a confirmed diagnosis of COVID-19 or PUI in the past 14 days without wearing appropriate PPE? No  Have you been living in the same home as a person with confirmed diagnosis of COVID-19 or a PUI (household contact)? No  Have you been diagnosed with COVID-19? No

## 2020-03-19 NOTE — Progress Notes (Signed)
   03/19/20 1400  Psych Admission Type (Psych Patients Only)  Admission Status Involuntary  Psychosocial Assessment  Patient Complaints None  Eye Contact Fair  Facial Expression Flat  Affect Appropriate to circumstance  Speech Logical/coherent  Interaction Assertive  Motor Activity Other (Comment) (WDL)  Appearance/Hygiene Disheveled  Behavior Characteristics Cooperative;Appropriate to situation  Mood Pleasant  Thought Process  Coherency WDL  Content WDL  Delusions None reported or observed  Perception WDL  Hallucination None reported or observed  Judgment Impaired  Confusion None  Danger to Self  Current suicidal ideation? Denies  Self-Injurious Behavior No self-injurious ideation or behavior indicators observed or expressed   Agreement Not to Harm Self No  Description of Agreement verbal  Danger to Others  Danger to Others None reported or observed

## 2020-03-19 NOTE — [facility] Group Notes (Signed)
LCSW Group Therapy 03/18/20 1:40pm   Type of Therapy and Topic:  Group Therapy:  Setting Goals   Participation Level:  did not attend   Description of Group: In this process group, patients discussed using strengths to work toward goals and address challenges.  Patients identified two positive things about themselves and one goal they were working on.  Patients were given the opportunity to share openly and support each other's plan for self-empowerment.  The group discussed the value of gratitude and were encouraged to have a daily reflection of positive characteristics or circumstances.  Patients were encouraged to identify a plan to utilize their strengths to work on current challenges and goals.   Therapeutic Goals 1. Patient will verbalize personal strengths/positive qualities and relate how these can assist with achieving desired personal goals 2. Patients will verbalize affirmation of peers plans for personal change and goal setting 3. Patients will explore the value of gratitude and positive focus as related to successful achievement of goals 4. Patients will verbalize a plan for regular reinforcement of personal positive qualities and circumstances.   Summary of Patient Progress: Patient did not attend.    Therapeutic Modalities Cognitive Behavioral Therapy Motivational Interviewing

## 2020-03-19 NOTE — [facility] Suicide Risk Assessment (Signed)
[facility] INPATIENT:  Family/Significant Other Suicide Prevention Education  Suicide Prevention Education:  Contact Attempts: wife Trystian Crisanto [phone], (name of family member/significant other) has been identified by the patient as the family member/significant other with whom the patient will be residing, and identified as the person(s) who will aid the patient in the event of a mental health crisis.  With written consent from the patient, two attempts were made to provide suicide prevention education, prior to and/or following the patient's discharge.  We were unsuccessful in providing suicide prevention education.  A suicide education pamphlet was given to the patient to share with family/significant other.  Date and time of first attempt:  03/19/2020    /  4:40pm  HIPAA-compliant VM left Date and time of second attempt:   To be attempted by CSW team  Lynnell Chad 03/19/2020, 4:40 PM

## 2020-03-19 NOTE — Progress Notes (Signed)
   03/19/20 2319  Psych Admission Type (Psych Patients Only)  Admission Status Involuntary  Psychosocial Assessment  Patient Complaints Anxiety  Eye Contact Fair  Facial Expression Flat  Affect Appropriate to circumstance  Speech Logical/coherent  Interaction Assertive  Motor Activity Other (Comment) (WDL)  Appearance/Hygiene Disheveled  Behavior Characteristics Appropriate to situation  Mood Pleasant  Thought Process  Coherency WDL  Content WDL  Delusions None reported or observed  Perception WDL  Hallucination None reported or observed  Judgment Impaired  Confusion None  Danger to Self  Current suicidal ideation? Denies  Self-Injurious Behavior No self-injurious ideation or behavior indicators observed or expressed   Agreement Not to Harm Self No  Description of Agreement verbal  Danger to Others  Danger to Others None reported or observed
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
## 2020-03-20 MED ORDER — ACAMPROSATE CALCIUM 333 MG PO TBEC
666.0000 mg | DELAYED_RELEASE_TABLET | Freq: Three times a day (TID) | ORAL | Status: DC
  Administered 2020-03-20 – 2020-03-25 (×14): 666 mg via ORAL
  Filled 2020-03-20 (×7): qty 2
  Filled 2020-03-20 (×2): qty 42
  Filled 2020-03-20 (×10): qty 2
  Filled 2020-03-20: qty 42
  Filled 2020-03-20: qty 2

## 2020-03-20 MED ORDER — HYDROXYZINE HCL 25 MG PO TABS
25.0000 mg | ORAL_TABLET | Freq: Four times a day (QID) | ORAL | Status: DC | PRN
  Administered 2020-03-21 – 2020-03-24 (×6): 25 mg via ORAL
  Filled 2020-03-20 (×4): qty 1
  Filled 2020-03-20: qty 10
  Filled 2020-03-20 (×2): qty 1

## 2020-03-20 NOTE — Progress Notes (Signed)
Recreation Therapy Notes  Date: 7.12.21 Time: 0930 Location: [address] Dayroom  Group Topic: Stress Management  Goal Area(s) Addresses:  Patient will identify positive stress management techniques. Patient will identify benefits of using stress management post d/c.    Intervention: Stress Management  Activity:  Meditation.  LRT played a meditation that focused on being resilient in the face of change.  Patients were to listen and follow along as meditation played to engage in activity.    Education:  Stress Management, Discharge Planning.   Education Outcome: Acknowledges Education  Clinical Observations/Feedback: Pt did not attend group activity.    Aleisha Paone, LRT/CTRS         Mattisyn Cardona A 03/20/2020 10:46 AM 
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
## 2020-03-20 NOTE — Progress Notes (Signed)
The patient did not rate his day but shared in group that he slept for much of the day. His goal for tomorrow is to wake up earlier and go to group.
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
## 2020-03-20 NOTE — Tx Team (Cosign Needed)
Interdisciplinary Treatment and Diagnostic Plan Update  03/20/2020 Time of Session: 9:10am William Mccoy MRN: [id number]  Principal Diagnosis: <principal problem not specified>  Secondary Diagnoses: Active Problems:   Severe recurrent major depression without psychotic features (HCC)   Alcohol dependence with alcohol-induced mood disorder (HCC)   Current Medications:  Current Facility-Administered Medications  Medication Dose Route Frequency Provider Last Rate Last Admin  . acetaminophen (TYLENOL) tablet 650 mg  650 mg Oral Q6H PRN Nira Conn A, NP   650 mg at 03/18/20 1248  . citalopram (CELEXA) tablet 20 mg  20 mg Oral Daily Cobos, Rockey Situ, MD   20 mg at 03/20/20 2229  . hydrOXYzine (ATARAX/VISTARIL) tablet 25 mg  25 mg Oral Q6H PRN Nira Conn A, NP   25 mg at 03/20/20 7989  . ibuprofen (ADVIL) tablet 400 mg  400 mg Oral Q6H PRN Dagar, Geralynn Rile, MD   400 mg at 03/19/20 2125  . LORazepam (ATIVAN) tablet 1 mg  1 mg Oral Q6H PRN Nira Conn A, NP   1 mg at 03/20/20 0825  . LORazepam (ATIVAN) tablet 1 mg  1 mg Oral Daily Nira Conn A, NP      . menthol-cetylpyridinium (CEPACOL) lozenge 3 mg  1 lozenge Oral PRN Cobos, Rockey Situ, MD      . multivitamin with minerals tablet 1 tablet  1 tablet Oral Daily Nira Conn A, NP   1 tablet at 03/20/20 2119  . omega-3 acid ethyl esters (LOVAZA) capsule 1 g  1 g Oral BID Cobos, Rockey Situ, MD   1 g at 03/20/20 4174  . ondansetron (ZOFRAN-ODT) disintegrating tablet 4 mg  4 mg Oral Q6H PRN Nira Conn A, NP      . thiamine tablet 100 mg  100 mg Oral Daily Nira Conn A, NP   100 mg at 03/20/20 0814  . traZODone (DESYREL) tablet 50 mg  50 mg Oral QHS PRN Karsten Ro, MD   50 mg at 03/19/20 2125   PTA Medications: Medications Prior to Admission  Medication Sig Dispense Refill Last Dose  . melatonin 1 MG TABS tablet Take 3 mg by mouth at bedtime as needed (sleep).   Past Week at Unknown time  . nicotine (NICODERM CQ - DOSED IN MG/24 HOURS)  14 mg/24hr patch Place 14 mg onto the skin daily.   Past Week at Unknown time    Patient Stressors: Financial difficulties Health problems Marital or family conflict Substance abuse  Patient Strengths: Ability for insight Average or above average intelligence Capable of independent living Motivation for treatment/growth  Treatment Modalities: Medication Management, Group therapy, Case management,  1 to 1 session with clinician, Psychoeducation, Recreational therapy.   Physician Treatment Plan for Primary Diagnosis: <principal problem not specified> Long Term Goal(s): Improvement in symptoms so as ready for discharge Improvement in symptoms so as ready for discharge   Short Term Goals: Ability to identify changes in lifestyle to reduce recurrence of condition will improve Ability to verbalize feelings will improve Ability to disclose and discuss suicidal ideas Ability to demonstrate self-control will improve Ability to identify and develop effective coping behaviors will improve Compliance with prescribed medications will improve Ability to verbalize feelings will improve Ability to disclose and discuss suicidal ideas Compliance with prescribed medications will improve  Medication Management: Evaluate patient's response, side effects, and tolerance of medication regimen.  Therapeutic Interventions: 1 to 1 sessions, Unit Group sessions and Medication administration.  Evaluation of Outcomes: Progressing  Physician Treatment Plan for Secondary  Diagnosis: Active Problems:   Severe recurrent major depression without psychotic features (HCC)   Alcohol dependence with alcohol-induced mood disorder (HCC)  Long Term Goal(s): Improvement in symptoms so as ready for discharge Improvement in symptoms so as ready for discharge   Short Term Goals: Ability to identify changes in lifestyle to reduce recurrence of condition will improve Ability to verbalize feelings will improve Ability  to disclose and discuss suicidal ideas Ability to demonstrate self-control will improve Ability to identify and develop effective coping behaviors will improve Compliance with prescribed medications will improve Ability to verbalize feelings will improve Ability to disclose and discuss suicidal ideas Compliance with prescribed medications will improve     Medication Management: Evaluate patient's response, side effects, and tolerance of medication regimen.  Therapeutic Interventions: 1 to 1 sessions, Unit Group sessions and Medication administration.  Evaluation of Outcomes: Progressing   RN Treatment Plan for Primary Diagnosis: <principal problem not specified> Long Term Goal(s): Knowledge of disease and therapeutic regimen to maintain health will improve  Short Term Goals: Ability to remain free from injury will improve, Ability to disclose and discuss suicidal ideas, Ability to identify and develop effective coping behaviors will improve and Compliance with prescribed medications will improve  Medication Management: RN will administer medications as ordered by provider, will assess and evaluate patient's response and provide education to patient for prescribed medication. RN will report any adverse and/or side effects to prescribing provider.  Therapeutic Interventions: 1 on 1 counseling sessions, Psychoeducation, Medication administration, Evaluate responses to treatment, Monitor vital signs and CBGs as ordered, Perform/monitor CIWA, COWS, AIMS and Fall Risk screenings as ordered, Perform wound care treatments as ordered.  Evaluation of Outcomes: Progressing   LCSW Treatment Plan for Primary Diagnosis: <principal problem not specified> Long Term Goal(s): Safe transition to appropriate next level of care at discharge, Engage patient in therapeutic group addressing interpersonal concerns.  Short Term Goals: Engage patient in aftercare planning with referrals and resources, Increase  social support, Identify triggers associated with mental health/substance abuse issues and Increase skills for wellness and recovery  Therapeutic Interventions: Assess for all discharge needs, 1 to 1 time with Social worker, Explore available resources and support systems, Assess for adequacy in community support network, Educate family and significant other(s) on suicide prevention, Complete Psychosocial Assessment, Interpersonal group therapy.  Evaluation of Outcomes: Progressing   Progress in Treatment: Attending groups: No. Participating in groups: No. Taking medication as prescribed: Yes. Toleration medication: Yes. Family/Significant other contact made: No, will contact:  wife. Patient understands diagnosis: Yes. Discussing patient identified problems/goals with staff: Yes. Medical problems stabilized or resolved: Yes. Denies suicidal/homicidal ideation: Yes. Issues/concerns per patient self-inventory: No. Other: none.  New problem(s) identified: No, Describe:  CSW will assess.  New Short Term/Long Term Goal(s):  Patient Goals:  Pt did not attend.  Discharge Plan or Barriers:   Reason for Continuation of Hospitalization: Depression Medication stabilization  Estimated Length of Stay: 3-5 days  Attendees: Patient: 03/20/2020   Physician:  03/20/2020   Nursing:  03/20/2020  RN Care Manager: 03/20/2020   Social Worker: Ruthann Cancer, LCSWA 03/20/2020   Recreational Therapist:  03/20/2020   Other:  03/20/2020   Other:  03/20/2020   Other: 03/20/2020     Scribe for Treatment Team: Otelia Santee, LCSWA 03/20/2020 10:15 AM

## 2020-03-20 NOTE — Progress Notes (Signed)
D: Patient Presents with depressed mood and affect.  Patient was calm and cooperative during med pass and took his medicine without incident.  Patient was isolative.  Patient denies suicidal thoughts and self harming thoughts. Pt. Rates anxiety 6/10 and depression 4/10 .A:  Patient took scheduled medicine.  Support and encouragement provided Routine safety checks conducted every 15 minutes. Patient  Informed to notify staff with any concerns.  R:Safety maintained.

## 2020-03-21 MED ORDER — CITALOPRAM HYDROBROMIDE 10 MG PO TABS
30.0000 mg | ORAL_TABLET | Freq: Every day | ORAL | Status: DC
  Administered 2020-03-22 – 2020-03-25 (×4): 30 mg via ORAL
  Filled 2020-03-21: qty 21
  Filled 2020-03-21 (×6): qty 3

## 2020-03-21 NOTE — Progress Notes (Signed)
   03/20/20 2000  COVID-19 Daily Checkoff  Have you had a fever (temp > 37.80C/100F)  in the past 24 hours?  No  COVID-19 EXPOSURE  Have you traveled outside the state in the past 14 days? No  Have you been in contact with someone with a confirmed diagnosis of COVID-19 or PUI in the past 14 days without wearing appropriate PPE? No  Have you been living in the same home as a person with confirmed diagnosis of COVID-19 or a PUI (household contact)? No  Have you been diagnosed with COVID-19? No

## 2020-03-21 NOTE — Progress Notes (Addendum)
Pt reports that his goal is to stay sober after discharge. 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 Reports that he was intoxicated during his suicide attempt and his stressor had been that his son's mom wouldn't let him see his child. He reports that he would come home from work and drink up to 20 ounces to vodka before falling asleep. Pt c/o of a sore throat for which his advil and cepacol were administered. Pt denies SI/HI and AVH. Active listening, reassurance, and support provided. Q 15 min safety checks continue. Safety has been maintained.     03/20/20 2000  Psych Admission Type (Psych Patients Only)  Admission Status Involuntary  Psychosocial Assessment  Patient Complaints Anxiety;Insomnia  Eye Contact Fair  Facial Expression Flat  Affect Appropriate to circumstance  Speech Logical/coherent  Interaction Assertive  Motor Activity Other (Comment) (WDL)  Appearance/Hygiene Unremarkable  Behavior Characteristics Cooperative;Appropriate to situation;Calm  Mood Anxious;Pleasant  Thought Process  Coherency WDL  Content WDL  Delusions None reported or observed  Perception WDL  Hallucination None reported or observed  Judgment Impaired  Confusion None  Danger to Self  Current suicidal ideation? Denies  Self-Injurious Behavior No self-injurious ideation or behavior indicators observed or expressed   Agreement Not to Harm Self Yes  Description of Agreement verbally contracts for safety  Danger to Others  Danger to Others None reported or observed

## 2020-03-21 NOTE — Progress Notes (Signed)
Pt presents with anxiety and depression.  Pt has flat affect.  Pt denied SI/HI/AVH. Pt has spent a great deal of the day resting in bed.  Pt has taken medications without incident and no major side effects were noted. Q 15 min safety checks remain in place.  RN will continue to monitor and provide support as needed.

## 2020-03-21 NOTE — Progress Notes (Signed)
   03/21/20 2142  Psych Admission Type (Psych Patients Only)  Admission Status Involuntary  Psychosocial Assessment  Patient Complaints Anxiety  Eye Contact Fair  Facial Expression Flat  Affect Appropriate to circumstance  Speech Logical/coherent  Interaction Assertive  Motor Activity Other (Comment) (WDL)  Appearance/Hygiene Unremarkable  Behavior Characteristics Cooperative;Appropriate to situation  Mood Anxious  Thought Process  Coherency WDL  Content WDL  Delusions None reported or observed  Perception WDL  Hallucination None reported or observed  Judgment Impaired  Confusion None  Danger to Self  Current suicidal ideation? Denies  Self-Injurious Behavior No self-injurious ideation or behavior indicators observed or expressed   Agreement Not to Harm Self Yes  Description of Agreement verbally contracts for safety  Danger to Others  Danger to Others None reported or observed  D: Patient in dayroom reports he had a good day. Pt denies any withdrawal symptoms. A: Medications administered as prescribed. Support and encouragement provided as needed.  R: Patient remains safe on the unit. Will continue to monitor for safety and stability.

## 2020-03-21 NOTE — Progress Notes (Signed)
Psychoeducational Group Note  Date:  03/21/2020 Time:  2032  Group Topic/Focus:  Wrap-Up Group:   The focus of this group is to help patients review their daily goal of treatment and discuss progress on daily workbooks.  Participation Level: Did Not Attend  Participation Quality:  Not Applicable  Affect:  Not Applicable  Cognitive:  Not Applicable  Insight:  Not Applicable  Engagement in Group: Not Applicable  Additional Comments:  The patient did not attend group this evening.   Hazle Coca S 03/21/2020, 8:32 PM
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
## 2020-03-22 NOTE — Progress Notes (Signed)
[facility] MD Progress Note  03/22/2020 10:09 AM William Mccoy  MRN:  [id number] Subjective: Patient states on the scale of 1 to 10, 10 being severely depressed his mood is at 5. He states he has passive suicidal ideations of better being dead but no plan. He states he slept well last night and is not fatigued today.He denies any homicidal ideation or AVH. He states he still feels anxious all the time. He adds he has better appetite today and had a good breakfast but ate a bacon and again having pain during swallowing.   Objective: Patient is seen and examined. He is a 39 year old male with a history of depression, ADHD and past suicide attempt 20 years ago admitted for suicide attempt by hanging. He looks anxious and depressed but far better than yesterday . He had good eye contact and is smiling more today. He mentioned about his telephone conversations with his wife and feels supported from home. Speech is normal rate but in decreased volume. Vitals are stable today and no new labs.  Principal Problem: <principal problem not specified> Diagnosis: Active Problems:   Severe recurrent major depression without psychotic features (HCC)   Alcohol dependence with alcohol-induced mood disorder (HCC)  Total Time spent with patient: 30 minutes  Past Psychiatric History: See H & P  Past Medical History: History reviewed. No pertinent past medical history. History reviewed. No pertinent surgical history. Family History: History reviewed. No pertinent family history. Family Psychiatric  History: See H & P Social History:  Social History   Substance and Sexual Activity  Alcohol Use Yes  . Alcohol/week: 6.0 standard drinks  . Types: 6 Shots of liquor per week   Comment: every day     Social History   Substance and Sexual Activity  Drug Use Never    Social History   Socioeconomic History  . Marital status: Single    Spouse name: Not on file  . Number of children: Not on file  . Years of education:  Not on file  . Highest education level: Not on file  Occupational History  . Not on file  Tobacco Use  . Smoking status: Current Every Day Smoker    Packs/day: 1.00    Types: Cigarettes  . Smokeless tobacco: Never Used  Substance and Sexual Activity  . Alcohol use: Yes    Alcohol/week: 6.0 standard drinks    Types: 6 Shots of liquor per week    Comment: every day  . Drug use: Never  . Sexual activity: Yes  Other Topics Concern  . Not on file  Social History Narrative  . Not on file   Social Determinants of Health   Financial Resource Strain:   . Difficulty of Paying Living Expenses:   Food Insecurity:   . Worried About Programme researcher, broadcasting/film/video in the Last Year:   . Barista in the Last Year:   Transportation Needs:   . Freight forwarder (Medical):   Marland Kitchen Lack of Transportation (Non-Medical):   Physical Activity:   . Days of Exercise per Week:   . Minutes of Exercise per Session:   Stress:   . Feeling of Stress :   Social Connections:   . Frequency of Communication with Friends and Family:   . Frequency of Social Gatherings with Friends and Family:   . Attends Religious Services:   . Active Member of Clubs or Organizations:   . Attends Banker Meetings:   .  Marital Status:    Additional Social History:                         Sleep: Good  Appetite:  Good  Current Medications: Current Facility-Administered Medications  Medication Dose Route Frequency Provider Last Rate Last Admin  . acamprosate (CAMPRAL) tablet 666 mg  666 mg Oral TID WC Allysia Ingles, Geralynn Rile, MD   666 mg at 03/22/20 0636  . citalopram (CELEXA) tablet 30 mg  30 mg Oral Daily Ladislao Cohenour, MD   30 mg at 03/22/20 0742  . hydrOXYzine (ATARAX/VISTARIL) tablet 25 mg  25 mg Oral Q6H PRN Nira Conn A, NP   25 mg at 03/21/20 2124  . ibuprofen (ADVIL) tablet 400 mg  400 mg Oral Q6H PRN Sangita Zani, Geralynn Rile, MD   400 mg at 03/20/20 2228  . menthol-cetylpyridinium (CEPACOL) lozenge 3 mg  1  lozenge Oral PRN Cobos, Rockey Situ, MD   3 mg at 03/20/20 2230  . multivitamin with minerals tablet 1 tablet  1 tablet Oral Daily Nira Conn A, NP   1 tablet at 03/22/20 0742  . omega-3 acid ethyl esters (LOVAZA) capsule 1 g  1 g Oral BID Cobos, Rockey Situ, MD   1 g at 03/22/20 0742  . thiamine tablet 100 mg  100 mg Oral Daily Nira Conn A, NP   100 mg at 03/22/20 0742  . traZODone (DESYREL) tablet 50 mg  50 mg Oral QHS PRN Karsten Ro, MD   50 mg at 03/21/20 2124    Lab Results: No results found for this or any previous visit (from the past 48 hour(s)).  Blood Alcohol level:  Lab Results  Component Value Date   ETH 221 (H) 03/16/2020    Metabolic Disorder Labs: Lab Results  Component Value Date   HGBA1C 5.1 03/18/2020   MPG 99.67 03/18/2020   No results found for: PROLACTIN Lab Results  Component Value Date   CHOL 214 (H) 03/18/2020   TRIG 296 (H) 03/18/2020   HDL 35 (L) 03/18/2020   CHOLHDL 6.1 03/18/2020   VLDL 59 (H) 03/18/2020   LDLCALC 120 (H) 03/18/2020    Physical Findings: AIMS: Facial and Oral Movements Muscles of Facial Expression: None, normal Lips and Perioral Area: None, normal Jaw: None, normal Tongue: None, normal,Extremity Movements Upper (arms, wrists, hands, fingers): None, normal Lower (legs, knees, ankles, toes): None, normal, Trunk Movements Neck, shoulders, hips: None, normal, Overall Severity Severity of abnormal movements (highest score from questions above): None, normal Incapacitation due to abnormal movements: None, normal Patient's awareness of abnormal movements (rate only patient's report): No Awareness, Dental Status Current problems with teeth and/or dentures?: No Does patient usually wear dentures?: No  CIWA:  CIWA-Ar Total: 0 COWS:     Musculoskeletal: Strength & Muscle Tone: within normal limits Gait & Station: normal Patient leans: N/A  Psychiatric Specialty Exam: Physical Exam Constitutional:      Appearance: Normal  appearance.  HENT:     Mouth/Throat:     Comments: Odynophagia  Musculoskeletal:        General: Normal range of motion.  Neurological:     General: No focal deficit present.     Mental Status: He is alert and oriented to person, place, and time.     Review of Systems  Blood pressure 134/82, pulse 92, temperature 97.8 F (36.6 C), temperature source Oral, resp. rate 18, height 6\' 1"  (1.854 m), weight 108.9 kg, SpO2 98 %.Body mass index  is 31.66 kg/m.  General Appearance: Casual  Eye Contact:  Good  Speech:  Clear and Coherent  Volume:  Decreased  Mood:  Anxious  Affect:  Depressed  Thought Process:  Goal Directed  Orientation:  Full (Time, Place, and Person)  Thought Content:  WDL  Suicidal Thoughts:  Yes.  without intent/plan  Homicidal Thoughts:  No  Memory:  Immediate;   Good Recent;   Good  Judgement:  Intact  Insight:  Present  Psychomotor Activity:  Normal  Concentration:  Concentration: Good and Attention Span: Good  Recall:  Good  Fund of Knowledge:  Good  Language:  Good  Akathisia:  No  Handed:  Right  AIMS (if indicated):     Assets:  Communication Skills Desire for Improvement Housing Resilience Social Support  ADL's:  Intact  Cognition:  WNL  Sleep:  Number of Hours: 5.75   Assessment:  Patient is a 40 year old male with a history of depression, ADHD and past suicide attempt admitted following a suicide attempt under alcohol intoxication by hanging. He reports a history of suicidal ideation and depressive symptoms such as difficulty falling asleep, early morning awakenings but not able to get out of bed- low motivation, depressed mood, worthlessness,hopelessness and suicidal ideation over the past 2 years. His recent attempt he reports as being impulsive and in the context of alcohol intoxication.  Today he reports improving mood and is responding well to medications with out any side effects. He has passive SI but no plan, no HI or AVH. He is overall  improving and has good insight about his diagnosis and medications.   D/D:  Major depressive disorder - most likely given history and presentation (difficulty falling asleep, early morning awakenings but not able to get out of bed- low motivation, depressed mood, worthlessness,hopelessness and suicidal ideation more than 2 weeks)  Alcohol induced mood disorder - because of daily alcohol intake in the evening, depressed mood.  Treatment Plan Summary: Daily contact with patient to assess and evaluate symptoms and progress in treatment.  Plan:  1. Continue Celexa 30 mg PO daily for depression and anxiety. 2. Continue Camprol 666 mg TID to reduce alcohol craving. 3. Continue trazodone 50 mg daily PRN for insomnia 4. Continue vistaril 25mg  Q6H PRN for anxiety  5. Encourage group participation. 6. Disposition in progress.    , MD 03/22/2020, 10:09 AM

## 2020-03-22 NOTE — Progress Notes (Signed)
   03/22/20 2010  Psych Admission Type (Psych Patients Only)  Admission Status Involuntary  Psychosocial Assessment  Patient Complaints Anxiety;Depression;Isolation  Eye Contact Fair  Facial Expression Flat  Affect Appropriate to circumstance;Anxious  Speech Logical/coherent  Interaction Assertive;Isolative  Motor Activity Other (Comment) (WDL)  Appearance/Hygiene Unremarkable  Behavior Characteristics Anxious;Cooperative  Mood Depressed;Anxious;Pleasant  Thought Process  Coherency WDL  Content WDL  Delusions None reported or observed  Perception WDL  Hallucination None reported or observed  Judgment Impaired  Confusion None  Danger to Self  Current suicidal ideation? Denies  Self-Injurious Behavior No self-injurious ideation or behavior indicators observed or expressed   Agreement Not to Harm Self Yes  Description of Agreement verbally contracts for safety  Danger to Others  Danger to Others None reported or observed

## 2020-03-22 NOTE — Progress Notes (Signed)
   03/22/20 2010  COVID-19 Daily Checkoff  Have you had a fever (temp > 37.80C/100F)  in the past 24 hours?  No  COVID-19 EXPOSURE  Have you traveled outside the state in the past 14 days? No  Have you been in contact with someone with a confirmed diagnosis of COVID-19 or PUI in the past 14 days without wearing appropriate PPE? No  Have you been living in the same home as a person with confirmed diagnosis of COVID-19 or a PUI (household contact)? No  Have you been diagnosed with COVID-19? No

## 2020-03-22 NOTE — Progress Notes (Signed)
   03/22/20 0639  Vital Signs  Pulse Rate 92  BP 134/82  BP Method Automatic  Oxygen Therapy  SpO2 98 %   D: Patient took all medicine. Patient isolated in room. Patient admitted to some passive SI, but deniesHI/ AVH.  A:  Patient took scheduled medicine.  Support and encouragement provided Routine safety checks conducted every 15 minutes. Patient  Informed to notify staff with any concerns. R:  Safety maintained.

## 2020-03-23 MED ORDER — GABAPENTIN 600 MG PO TABS
300.0000 mg | ORAL_TABLET | Freq: Two times a day (BID) | ORAL | Status: DC
  Administered 2020-03-23: 300 mg via ORAL
  Filled 2020-03-23: qty 0.5
  Filled 2020-03-23: qty 1
  Filled 2020-03-23 (×2): qty 0.5

## 2020-03-23 MED ORDER — TRAZODONE HCL 100 MG PO TABS
100.0000 mg | ORAL_TABLET | Freq: Every evening | ORAL | Status: DC | PRN
  Administered 2020-03-23 – 2020-03-24 (×2): 100 mg via ORAL
  Filled 2020-03-23 (×2): qty 1
  Filled 2020-03-23: qty 7

## 2020-03-23 MED ORDER — GABAPENTIN 300 MG PO CAPS
300.0000 mg | ORAL_CAPSULE | Freq: Two times a day (BID) | ORAL | Status: DC
  Administered 2020-03-23 – 2020-03-25 (×4): 300 mg via ORAL
  Filled 2020-03-23: qty 1
  Filled 2020-03-23: qty 14
  Filled 2020-03-23 (×5): qty 1
  Filled 2020-03-23: qty 14

## 2020-03-23 NOTE — Progress Notes (Signed)
   03/23/20 0636  Vital Signs  Temp 98 F (36.7 C)  Temp Source Oral  Pulse Rate 84  BP 117/87  BP Location Left Arm  BP Method Automatic  Patient Position (if appropriate) Standing  Oxygen Therapy  SpO2 96 %   D:  Patient denies SI/HI/AVS.  Patient rated anxiety 4/10 and 0/10 for depression. Patient reported poor sleep d/t snoring roommate.  A:  Patient took scheduled medicine.  Support and encouragement provided Routine safety checks conducted every 15 minutes. Patient  Informed to notify staff with any concerns.   R: Safety maintained.
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
## 2020-03-23 NOTE — Progress Notes (Signed)
   03/23/20 2130  Psych Admission Type (Psych Patients Only)  Admission Status Involuntary  Psychosocial Assessment  Patient Complaints Anxiety  Eye Contact Fair  Facial Expression Anxious  Affect Appropriate to circumstance;Anxious  Speech Logical/coherent  Interaction Assertive;Isolative  Motor Activity Other (Comment) (WNL)  Appearance/Hygiene Unremarkable  Behavior Characteristics Cooperative;Anxious  Mood Anxious  Thought Process  Coherency WDL  Content WDL  Delusions None reported or observed  Perception WDL  Hallucination None reported or observed  Judgment Impaired  Confusion None  Danger to Self  Current suicidal ideation? Denies  Danger to Others  Danger to Others None reported or observed   Pt rates anxiety 6/10. "I'm always anxious. It's always there." Pt denied any other complaints.

## 2020-03-23 NOTE — Progress Notes (Signed)
Pt did attend the evening wrap up group. Pt was attentive, supportive, and sharing. Positive thinking and positive change were discussed.  

## 2020-03-24 MED ORDER — SALINE SPRAY 0.65 % NA SOLN: 1.0000 | NASAL | Status: DC | PRN

## 2020-03-24 NOTE — Progress Notes (Addendum)
[facility] MD Progress Note  03/24/2020 7:32 PM MAXIMUM REILAND  MRN:  [id number]   Subjective:  Patient states his mood is good today. He states has no suicidal ideations or homicidal ideations now. He states he slept fine and does not report any medication side effects except sleeping more. He explains more sleep may be due to boredom. He denies any auditory or visual hallucinations. He states his appetite is fine.   Objective: Patient is seen and examined. He is a 39 year old male with a history of depression, ADHD and past suicide attempt 20 years ago admitted for suicide attempt by hanging. He has good eye contact and looks cheerful today. He looks little fatigued but overall much improved. Vitals are normal today except pulse is 107. No new labs today.  Principal Problem: <principal problem not specified> Diagnosis: Active Problems:   Severe recurrent major depression without psychotic features (HCC)   Alcohol dependence with alcohol-induced mood disorder (HCC)  Total Time spent with patient: 20 minutes . Past Psychiatric History: See H & P  Past Medical History: History reviewed. No pertinent past medical history. History reviewed. No pertinent surgical history. Family History: History reviewed. No pertinent family history. Family Psychiatric  History: See H & P Social History:  Social History   Substance and Sexual Activity  Alcohol Use Yes   Alcohol/week: 6.0 standard drinks   Types: 6 Shots of liquor per week   Comment: every day     Social History   Substance and Sexual Activity  Drug Use Never    Social History   Socioeconomic History   Marital status: Single    Spouse name: Not on file   Number of children: Not on file   Years of education: Not on file   Highest education level: Not on file  Occupational History   Not on file  Tobacco Use   Smoking status: Current Every Day Smoker    Packs/day: 1.00    Types: Cigarettes   Smokeless tobacco: Never Used  Substance  and Sexual Activity   Alcohol use: Yes    Alcohol/week: 6.0 standard drinks    Types: 6 Shots of liquor per week    Comment: every day   Drug use: Never   Sexual activity: Yes  Other Topics Concern   Not on file  Social History Narrative   Not on file   Social Determinants of Health   Financial Resource Strain:    Difficulty of Paying Living Expenses:   Food Insecurity:    Worried About Programme researcher, broadcasting/film/video in the Last Year:    Barista in the Last Year:   Transportation Needs:    Freight forwarder (Medical):    Lack of Transportation (Non-Medical):   Physical Activity:    Days of Exercise per Week:    Minutes of Exercise per Session:   Stress:    Feeling of Stress :   Social Connections:    Frequency of Communication with Friends and Family:    Frequency of Social Gatherings with Friends and Family:    Attends Religious Services:    Active Member of Clubs or Organizations:    Attends Banker Meetings:    Marital Status:    Additional Social History:                         Sleep: Good  Appetite:  Good  Current Medications: Current Facility-Administered Medications  Medication Dose Route Frequency Provider Last Rate Last Admin   acamprosate (CAMPRAL) tablet 666 mg  666 mg Oral TID WC Dagar, Anjali, MD   666 mg at 03/24/20 1649   citalopram (CELEXA) tablet 30 mg  30 mg Oral Daily Dagar, Geralynn Rile, MD   30 mg at 03/24/20 4098   gabapentin (NEURONTIN) capsule 300 mg  300 mg Oral BID Cobos, Rockey Situ, MD   300 mg at 03/24/20 1649   hydrOXYzine (ATARAX/VISTARIL) tablet 25 mg  25 mg Oral Q6H PRN Nira Conn A, NP   25 mg at 03/23/20 2138   ibuprofen (ADVIL) tablet 400 mg  400 mg Oral Q6H PRN Dagar, Geralynn Rile, MD   400 mg at 03/20/20 2228   menthol-cetylpyridinium (CEPACOL) lozenge 3 mg  1 lozenge Oral PRN Cobos, Rockey Situ, MD   3 mg at 03/20/20 2230   multivitamin with minerals tablet 1 tablet  1 tablet Oral Daily Nira Conn A, NP   1  tablet at 03/24/20 1191   omega-3 acid ethyl esters (LOVAZA) capsule 1 g  1 g Oral BID Cobos, Rockey Situ, MD   1 g at 03/24/20 1649   sodium chloride (OCEAN) 0.65 % nasal spray 1 spray  1 spray Each Nare PRN Antonieta Pert, MD       thiamine tablet 100 mg  100 mg Oral Daily Nira Conn A, NP   100 mg at 03/24/20 4782   traZODone (DESYREL) tablet 100 mg  100 mg Oral QHS PRN Dagar, Geralynn Rile, MD   100 mg at 03/23/20 2137    Lab Results: No results found for this or any previous visit (from the past 48 hour(s)).  Blood Alcohol level:  Lab Results  Component Value Date   ETH 221 (H) 03/16/2020    Metabolic Disorder Labs: Lab Results  Component Value Date   HGBA1C 5.1 03/18/2020   MPG 99.67 03/18/2020   No results found for: PROLACTIN Lab Results  Component Value Date   CHOL 214 (H) 03/18/2020   TRIG 296 (H) 03/18/2020   HDL 35 (L) 03/18/2020   CHOLHDL 6.1 03/18/2020   VLDL 59 (H) 03/18/2020   LDLCALC 120 (H) 03/18/2020    Physical Findings: AIMS: Facial and Oral Movements Muscles of Facial Expression: None, normal Lips and Perioral Area: None, normal Jaw: None, normal Tongue: None, normal,Extremity Movements Upper (arms, wrists, hands, fingers): None, normal Lower (legs, knees, ankles, toes): None, normal, Trunk Movements Neck, shoulders, hips: None, normal, Overall Severity Severity of abnormal movements (highest score from questions above): None, normal Incapacitation due to abnormal movements: None, normal Patient's awareness of abnormal movements (rate only patient's report): No Awareness, Dental Status Current problems with teeth and/or dentures?: No Does patient usually wear dentures?: No  CIWA:  CIWA-Ar Total: 0 COWS:     Musculoskeletal: Strength & Muscle Tone: within normal limits Gait & Station: normal Patient leans: N/A  Psychiatric Specialty Exam: Physical Exam Constitutional:      Appearance: Normal appearance.  Musculoskeletal:     Cervical back:  Normal range of motion.  Neurological:     Mental Status: He is alert and oriented to person, place, and time.     Review of Systems  Blood pressure 109/85, pulse (!) 107, temperature 98.1 F (36.7 C), temperature source Oral, resp. rate 18, height 6\' 1"  (1.854 m), weight 108.9 kg, SpO2 96 %.Body mass index is 31.66 kg/m.  General Appearance: Casual  Eye Contact:  Good  Speech:  Normal Rate  Volume:  Normal  Mood:   Good  Affect:  Appropriate  Thought Process:  Coherent  Orientation:  Full (Time, Place, and Person)  Thought Content:  WDL  Suicidal Thoughts:  No  Homicidal Thoughts:  No  Memory:  Immediate;   Good Recent;   Good  Judgement:  Fair  Insight:  Good  Psychomotor Activity:  Normal  Concentration:  Concentration: Good and Attention Span: Good  Recall:  Good  Fund of Knowledge:  Good  Language:  Good  Akathisia:  No  Handed:  Right  AIMS (if indicated):     Assets:  Communication Skills Desire for Improvement Social Support  ADL's:  Intact  Cognition:  WNL  Sleep:  Number of Hours: 5.75   Assessment:  Patient is a 39 year old male with a history of depression, ADHD and past suicide attempt admitted following a suicide attempt under alcohol intoxication by hanging. He reports a history of suicidal ideation and depressive symptoms such as difficulty falling asleep, early morning awakenings but not able to get out of bed- low motivation, depressed mood, worthlessness,hopelessness and suicidal ideation over the past 2 years. His recent attempt he reports as being impulsive and in the context of alcohol intoxication.  Today he reports good mood and is responding well to medications with out any side effects.He has no SI, no HI or AVH. He is overall improving and has good insight about his diagnosis and medications.    D/D:   Major depressive disorder - most likely given history and presentation (difficulty falling asleep, early morning awakenings but not able to get  out of bed- low motivation, depressed mood, worthlessness,hopelessness and suicidal ideation more than 2 weeks)   Alcohol induced mood disorder - because of daily alcohol intake in the evening, depressed mood.  Treatment Plan Summary: Daily contact with patient to assess and evaluate symptoms and progress in treatment.  Plan:  1. Continue Gabapentin 300 mg BID for anxiety 2. Continue trazodone 100 mg daily PRN for insomnia 3. Continue Celexa 30 mg PO daily for depression and anxiety. 4. Continue Camprol 666 mg TID to reduce alcohol craving. 5. Continue vistaril 25mg  Q6H PRN for anxiety  6. Encourage group participation. 7. Disposition in progress.  , MD 03/24/2020, 7:32 PM  Patient was seen and examined as per above. Patient denies suicidal ideation. Collateral information included current status as registered sex offender and that he has charges pending in [facility] for similar previous conviction. SW has contatcted patients' wife and she confirmed no children in the home. If things continue to go well anticipate discharge in 1 day.

## 2020-03-24 NOTE — [facility] Suicide Risk Assessment (Signed)
[facility] INPATIENT:  Family/Significant Other Suicide Prevention Education   Suicide Prevention Education: Education Completed; Wife, Nakoa Ganus,  has been identified by the patient as the family member/significant other with whom the patient will be residing, and identified as the person(s) who will aid the patient in the event of a mental health crisis (suicidal ideations/suicide attempt).  With written consent from the patient, the family member/significant other has been provided the following suicide prevention education, prior to the and/or following the discharge of the patient.  The suicide prevention education provided includes the following:  Suicide risk factors  Suicide prevention and interventions  National Suicide Hotline telephone number  [facility] assessment telephone number  [facility] Emergency Assistance 911  [facility] and/or Residential Mobile Crisis Unit telephone number   Request made of family/significant other to:  Remove weapons (e.g., guns, rifles, knives), all items previously/currently identified as safety concern.    Remove drugs/medications (over-the-counter, prescriptions, illicit drugs), all items previously/currently identified as a safety concern.   The family member/significant other verbalizes understanding of the suicide prevention education information provided.  The family member/significant other agrees to remove the items of safety concern listed above.  CSW spoke with patients wife who stated that she had no safety concerns with this patient returning home and stated that there were no weapons in the home. This patients wife also reported that there were no children in the home. Patient's wife stated that she would be able to pick this patient up at discharge.    Ruthann Cancer MSW, [employer] Clincal Social Worker  [facility]

## 2020-03-24 NOTE — Progress Notes (Signed)
Pt denies SI/HI/AVH. Pt reported that he could did not sleep well last night.  Pt endorses having anxiety and depression but states that he is improving in these areas.  RN administered pt's medications per provider orders.  RN actively listened to pt and provided pt with encouragement and reassurance.  Pt stayed to himself for most of the day.  He leaves his room to talk to staff and go to the cafeteria for meals.  No adverse reactions to medications were noted.  Q 15 min safety checks remain in place.

## 2020-03-24 NOTE — Tx Team (Signed)
Interdisciplinary Treatment and Diagnostic Plan Update  03/24/2020 Time of Session: 9:05am William Mccoy MRN: [id number]  Principal Diagnosis: <principal problem not specified>  Secondary Diagnoses: Active Problems:   Severe recurrent major depression without psychotic features (HCC)   Alcohol dependence with alcohol-induced mood disorder (HCC)   Current Medications:  Current Facility-Administered Medications  Medication Dose Route Frequency Provider Last Rate Last Admin  . acamprosate (CAMPRAL) tablet 666 mg  666 mg Oral TID WC Dagar, Geralynn Rile, MD   666 mg at 03/24/20 9833  . citalopram (CELEXA) tablet 30 mg  30 mg Oral Daily Dagar, Anjali, MD   30 mg at 03/24/20 0823  . gabapentin (NEURONTIN) capsule 300 mg  300 mg Oral BID Cobos, Rockey Situ, MD   300 mg at 03/24/20 0823  . hydrOXYzine (ATARAX/VISTARIL) tablet 25 mg  25 mg Oral Q6H PRN Nira Conn A, NP   25 mg at 03/23/20 2138  . ibuprofen (ADVIL) tablet 400 mg  400 mg Oral Q6H PRN Dagar, Geralynn Rile, MD   400 mg at 03/20/20 2228  . menthol-cetylpyridinium (CEPACOL) lozenge 3 mg  1 lozenge Oral PRN Cobos, Rockey Situ, MD   3 mg at 03/20/20 2230  . multivitamin with minerals tablet 1 tablet  1 tablet Oral Daily Nira Conn A, NP   1 tablet at 03/24/20 0823  . omega-3 acid ethyl esters (LOVAZA) capsule 1 g  1 g Oral BID Cobos, Rockey Situ, MD   1 g at 03/24/20 8250  . sodium chloride (OCEAN) 0.65 % nasal spray 1 spray  1 spray Each Nare PRN Antonieta Pert, MD      . thiamine tablet 100 mg  100 mg Oral Daily Nira Conn A, NP   100 mg at 03/24/20 0823  . traZODone (DESYREL) tablet 100 mg  100 mg Oral QHS PRN Dagar, Geralynn Rile, MD   100 mg at 03/23/20 2137   PTA Medications: Medications Prior to Admission  Medication Sig Dispense Refill Last Dose  . melatonin 1 MG TABS tablet Take 3 mg by mouth at bedtime as needed (sleep).   Past Week at Unknown time  . nicotine (NICODERM CQ - DOSED IN MG/24 HOURS) 14 mg/24hr patch Place 14 mg onto the skin  daily.   Past Week at Unknown time    Patient Stressors: Financial difficulties Health problems Marital or family conflict Substance abuse  Patient Strengths: Ability for insight Average or above average intelligence Capable of independent living Motivation for treatment/growth  Treatment Modalities: Medication Management, Group therapy, Case management,  1 to 1 session with clinician, Psychoeducation, Recreational therapy.   Physician Treatment Plan for Primary Diagnosis: <principal problem not specified> Long Term Goal(s): Improvement in symptoms so as ready for discharge Improvement in symptoms so as ready for discharge   Short Term Goals: Ability to identify changes in lifestyle to reduce recurrence of condition will improve Ability to verbalize feelings will improve Ability to disclose and discuss suicidal ideas Ability to demonstrate self-control will improve Ability to identify and develop effective coping behaviors will improve Compliance with prescribed medications will improve Ability to verbalize feelings will improve Ability to disclose and discuss suicidal ideas Compliance with prescribed medications will improve  Medication Management: Evaluate patient's response, side effects, and tolerance of medication regimen.  Therapeutic Interventions: 1 to 1 sessions, Unit Group sessions and Medication administration.  Evaluation of Outcomes: Progressing  Physician Treatment Plan for Secondary Diagnosis: Active Problems:   Severe recurrent major depression without psychotic features (HCC)   Alcohol dependence  with alcohol-induced mood disorder (HCC)  Long Term Goal(s): Improvement in symptoms so as ready for discharge Improvement in symptoms so as ready for discharge   Short Term Goals: Ability to identify changes in lifestyle to reduce recurrence of condition will improve Ability to verbalize feelings will improve Ability to disclose and discuss suicidal  ideas Ability to demonstrate self-control will improve Ability to identify and develop effective coping behaviors will improve Compliance with prescribed medications will improve Ability to verbalize feelings will improve Ability to disclose and discuss suicidal ideas Compliance with prescribed medications will improve     Medication Management: Evaluate patient's response, side effects, and tolerance of medication regimen.  Therapeutic Interventions: 1 to 1 sessions, Unit Group sessions and Medication administration.  Evaluation of Outcomes: Progressing   RN Treatment Plan for Primary Diagnosis: <principal problem not specified> Long Term Goal(s): Knowledge of disease and therapeutic regimen to maintain health will improve  Short Term Goals: Ability to remain free from injury will improve, Ability to disclose and discuss suicidal ideas and Compliance with prescribed medications will improve  Medication Management: RN will administer medications as ordered by provider, will assess and evaluate patient's response and provide education to patient for prescribed medication. RN will report any adverse and/or side effects to prescribing provider.  Therapeutic Interventions: 1 on 1 counseling sessions, Psychoeducation, Medication administration, Evaluate responses to treatment, Monitor vital signs and CBGs as ordered, Perform/monitor CIWA, COWS, AIMS and Fall Risk screenings as ordered, Perform wound care treatments as ordered.  Evaluation of Outcomes: Progressing   LCSW Treatment Plan for Primary Diagnosis: <principal problem not specified> Long Term Goal(s): Safe transition to appropriate next level of care at discharge, Engage patient in therapeutic group addressing interpersonal concerns.  Short Term Goals: Engage patient in aftercare planning with referrals and resources, Increase social support and Identify triggers associated with mental health/substance abuse issues  Therapeutic  Interventions: Assess for all discharge needs, 1 to 1 time with Social worker, Explore available resources and support systems, Assess for adequacy in community support network, Educate family and significant other(s) on suicide prevention, Complete Psychosocial Assessment, Interpersonal group therapy.  Evaluation of Outcomes: Progressing   Progress in Treatment: Attending groups: No. Participating in groups: No. Taking medication as prescribed: Yes. Toleration medication: Yes. Family/Significant other contact made: No, will contact:  wife. Patient understands diagnosis: Yes. Discussing patient identified problems/goals with staff: Yes. Medical problems stabilized or resolved: Yes. Denies suicidal/homicidal ideation: Yes. Issues/concerns per patient self-inventory: No. Other: none.  New problem(s) identified: Yes, Describe:  Open legal charges.  New Short Term/Long Term Goal(s):  Patient Goals:  Pt did not attend.  Discharge Plan or Barriers: Legal concerns  Reason for Continuation of Hospitalization: Depression Medication stabilization  Estimated Length of Stay: 1-3 days  Attendees: Patient: 03/24/2020   Physician:  03/24/2020   Nursing:  03/24/2020  RN Care Manager: 03/24/2020   Social Worker: Ruthann Cancer, LCSWA 03/24/2020   Recreational Therapist:  03/24/2020   Other:  03/24/2020   Other:  03/24/2020   Other: 03/24/2020     Scribe for Treatment Team: Otelia Santee, LCSWA 03/24/2020 11:04 AM

## 2020-03-24 NOTE — Progress Notes (Signed)
Pt c/o nasal stuffiness. Pt told to speak to provider in the morning if it persists.

## 2020-03-24 NOTE — Progress Notes (Signed)
   03/24/20 2025  COVID-19 Daily Checkoff  Have you had a fever (temp > 37.80C/100F)  in the past 24 hours?  No  COVID-19 EXPOSURE  Have you traveled outside the state in the past 14 days? No  Have you been in contact with someone with a confirmed diagnosis of COVID-19 or PUI in the past 14 days without wearing appropriate PPE? No  Have you been living in the same home as a person with confirmed diagnosis of COVID-19 or a PUI (household contact)? No  Have you been diagnosed with COVID-19? No

## 2020-03-25 MED ORDER — CITALOPRAM HYDROBROMIDE 10 MG PO TABS: 30.0000 mg | ORAL_TABLET | Freq: Every day | ORAL | 0 refills | Status: AC

## 2020-03-25 MED ORDER — OMEGA-3-ACID ETHYL ESTERS 1 G PO CAPS: 1.0000 g | ORAL_CAPSULE | Freq: Two times a day (BID) | ORAL | 0 refills | Status: AC

## 2020-03-25 MED ORDER — GABAPENTIN 300 MG PO CAPS: 300.0000 mg | ORAL_CAPSULE | Freq: Two times a day (BID) | ORAL | 0 refills | Status: AC

## 2020-03-25 MED ORDER — SALINE SPRAY 0.65 % NA SOLN: 1.0000 | NASAL | 0 refills | Status: AC | PRN

## 2020-03-25 MED ORDER — TRAZODONE HCL 100 MG PO TABS: 100.0000 mg | ORAL_TABLET | Freq: Every evening | ORAL | 0 refills | Status: AC | PRN

## 2020-03-25 MED ORDER — HYDROXYZINE HCL 25 MG PO TABS: 25.0000 mg | ORAL_TABLET | Freq: Four times a day (QID) | ORAL | 0 refills | Status: AC | PRN

## 2020-03-25 MED ORDER — ACAMPROSATE CALCIUM 333 MG PO TBEC: 666.0000 mg | DELAYED_RELEASE_TABLET | Freq: Three times a day (TID) | ORAL | 0 refills | Status: AC

## 2020-03-25 NOTE — Discharge Summary (Signed)
Physician Discharge Summary Note  Patient:  William Mccoy is an 39 y.o., male MRN:  [id number] DOB:  06-06-1981 Patient phone:  [phone] (home)  Patient address:   [address] [place] [address],   Total Time spent with patient: Greater than 30 minutes  Date of Admission:  03/17/2020  Date of Discharge: 03-25-20  Reason for Admission: Suicide attempt by hanging.  Principal Problem: Severe recurrent major depression without psychotic features [facility])  Discharge Diagnoses: Principal Problem:   Severe recurrent major depression without psychotic features (HCC) Active Problems:   Alcohol dependence with alcohol-induced mood disorder (HCC)  Past Psychiatric History: Severe, recurrent major depressive disorder.  Past Medical History: History reviewed. No pertinent past medical history. History reviewed. No pertinent surgical history.  Family History: History reviewed. No pertinent family history.  Family Psychiatric  History: See H&P  Social History:  Social History   Substance and Sexual Activity  Alcohol Use Yes  . Alcohol/week: 6.0 standard drinks  . Types: 6 Shots of liquor per week   Comment: every day     Social History   Substance and Sexual Activity  Drug Use Never    Social History   Socioeconomic History  . Marital status: Single    Spouse name: Not on file  . Number of children: Not on file  . Years of education: Not on file  . Highest education level: Not on file  Occupational History  . Not on file  Tobacco Use  . Smoking status: Current Every Day Smoker    Packs/day: 1.00    Types: Cigarettes  . Smokeless tobacco: Never Used  Substance and Sexual Activity  . Alcohol use: Yes    Alcohol/week: 6.0 standard drinks    Types: 6 Shots of liquor per week    Comment: every day  . Drug use: Never  . Sexual activity: Yes  Other Topics Concern  . Not on file  Social History Narrative  . Not on file   Social Determinants of Health    Financial Resource Strain:   . Difficulty of Paying Living Expenses:   Food Insecurity:   . Worried About Programme researcher, broadcasting/film/video in the Last Year:   . Barista in the Last Year:   Transportation Needs:   . Freight forwarder (Medical):   Marland Kitchen Lack of Transportation (Non-Medical):   Physical Activity:   . Days of Exercise per Week:   . Minutes of Exercise per Session:   Stress:   . Feeling of Stress :   Social Connections:   . Frequency of Communication with Friends and Family:   . Frequency of Social Gatherings with Friends and Family:   . Attends Religious Services:   . Active Member of Clubs or Organizations:   . 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 Patient is admitted to inpatient unit for further evaluation and stabilization. Today, patient feels guilty and states it's was really selfish of him to do that, hurting everybody around him. He explained that he was under lots of "stress" and just could not handle it. He had suicidal ideations on the day of the admission and tried to hang himself by putting noose of electrical cord around his neck. But it snapped, he fell and may have  lost consciousness. Earlier that day he had an argument with his child's mother, he was drinking and as per patient " I think the alcohol just made me act irrationally". He reports he has been experiencing depression ( in & out by days) and anxiety for the past year. He adds he had been experiencing intermittent suicidal ideations but never had plan. He states he has been sleeping ok but it is extremely difficult for him to get out of bed in the morning. He reports regular, frequently daily, alcohol consumption and has been drinking daily and heavily ( mainly Vodka) which he states he was sometimes  putting into a water bottle to hide use from family members. He denies substance abuse. He reports smoking 1 PPD and h/o alcohol dependence in paternal grandfather. He stated he had tried antidepressants in past but it was long time ago and does not remember the name. He denies AVH. He also complains of headache, neck pain, sore throat but no withdrawal symptoms yet. He sustained scalp laceration ( occipital area) which was sutured. (Head /Spine CT were negative for acute trauma or fx). He endorses neuro-vegetative symptoms- poor energy level, decreased appetite, anhedonia. He has h/o one prior psychiatric admission 20 years ago related to suicidal attempt by overdosing at the time. He describes history of depression which he characterizes as intermittent.Denies history of mania or hypomania, denies history of psychosis. Denies history of severe alcohol WDL symptoms, denies history of seizures or DTs. Denies medical illnesses, NKDA.  After the above admission evaluation, it was recommended based on his presenting symptoms that Andrei will benefit from mood stabilization treatment. And with his consent, he was started on the medication regimen that targeted his symptoms. He was instructed & explained the benefit/adverse effects of the medication in use. He was given the time to ask questions and voice any concerns that he may have. He received, stabilized & was discharged on the medications as listed below on his discharge medication lists. He was also enrolled & participated in the group counseling sessions being offered and held on this unit. He learned coping skills that should help him after discharge to cope better & maintain mood stability/sobriety after discharge.  Davontae's symptoms responded well to his treatment regimen. This is evidenced by his reports of improved mood, absence of suicidal ideations, homicidal ideations & or AV hallucinations. He is currently mentally & medically stable to be discharged to  continue mental health care, medication management & substance abuse treatment as noted below. He was provided with all the necessary information needed to make this appointment without problems.   During the course of his hospitalization, the 15-minute checks were adequate to ensure patient's safety. Patient did not display any dangerous, violent or suicidal behavior on the unit.  He interacted with patients & staff appropriately and participated appropriately in the group therapy sessions.  His medications were addressed & adjusted to meet his needs.  At the time of discharge, patient is not reporting any acute suicidal ideation & feels more confident about his self-care and managing suicidal thoughts/mental health.  Denies homicidal ideations.  Education and supportive counseling provided. He was able to engage in safety planning including plan to return to [facility] or contact emergency services if he feels unable to maintain his own safety or the safety of others. Pt had no further questions, comments, or concerns. He left [facility] with all personal belongings in no apparent distress. Transportation per his arrangement..  Physical Findings: AIMS: Facial and Oral  Movements Muscles of Facial Expression: None, normal Lips and Perioral Area: None, normal Jaw: None, normal Tongue: None, normal,Extremity Movements Upper (arms, wrists, hands, fingers): None, normal Lower (legs, knees, ankles, toes): None, normal, Trunk Movements Neck, shoulders, hips: None, normal, Overall Severity Severity of abnormal movements (highest score from questions above): None, normal Incapacitation due to abnormal movements: None, normal Patient's awareness of abnormal movements (rate only patient's report): No Awareness, Dental Status Current problems with teeth and/or dentures?: No Does patient usually wear dentures?: No  CIWA:  CIWA-Ar Total: 2 COWS:     Musculoskeletal: Strength & Muscle Tone: within normal limits Gait &  Station: normal Patient leans: N/A  Psychiatric Specialty Exam: Physical Exam Vitals and nursing note reviewed.  HENT:     Head: Normocephalic.     Nose: Nose normal.     Mouth/Throat:     Pharynx: Oropharynx is clear.  Eyes:     Pupils: Pupils are equal, round, and reactive to light.  Cardiovascular:     Rate and Rhythm: Normal rate.     Pulses: Normal pulses.  Pulmonary:     Effort: Pulmonary effort is normal.  Genitourinary:    Comments: Deferred Musculoskeletal:        General: Normal range of motion.     Cervical back: Normal range of motion.  Skin:    General: Skin is warm and dry.  Neurological:     Mental Status: He is alert and oriented to person, place, and time.     Review of Systems  Constitutional: Negative for chills, diaphoresis and fever.  HENT: Negative for congestion, rhinorrhea, sneezing and sore throat.   Eyes: Negative for discharge.  Respiratory: Negative for cough, chest tightness, shortness of breath and wheezing.   Cardiovascular: Negative for chest pain and palpitations.  Gastrointestinal: Negative for diarrhea, nausea and vomiting.  Endocrine: Negative for cold intolerance.  Genitourinary: Negative for difficulty urinating.  Musculoskeletal: Negative for arthralgias.  Allergic/Immunologic: Negative for environmental allergies.  Neurological: Negative for dizziness.  Psychiatric/Behavioral: Positive for dysphoric mood (Stabilized with medication prior to discharge) and sleep disturbance (Stabilized with medication prior to discharge). Negative for agitation, behavioral problems, confusion, decreased concentration, hallucinations and suicidal ideas. The patient is not hyperactive. Nervous/anxious: Stable upon discharge.     Blood pressure 112/83, pulse 87, temperature (!) 97.4 F (36.3 C), temperature source Oral, resp. rate 18, height 6\' 1"  (1.854 m), weight 108.9 kg, SpO2 99 %.Body mass index is 31.66 kg/m.  See Md's discharge SRA  Sleep:   Number of Hours: 3.5   Has this patient used any form of tobacco in the last 30 days? (Cigarettes, Smokeless Tobacco, Cigars, and/or Pipes): Yes,  an FDA-approved tobacco cessation medication was recommended at discharge.  Blood Alcohol level:  Lab Results  Component Value Date   ETH 221 (H) 03/16/2020   Metabolic Disorder Labs:  Lab Results  Component Value Date   HGBA1C 5.1 03/18/2020   MPG 99.67 03/18/2020   No results found for: PROLACTIN Lab Results  Component Value Date   CHOL 214 (H) 03/18/2020   TRIG 296 (H) 03/18/2020   HDL 35 (L) 03/18/2020   CHOLHDL 6.1 03/18/2020   VLDL 59 (H) 03/18/2020   LDLCALC 120 (H) 03/18/2020   See Psychiatric Specialty Exam and Suicide Risk Assessment completed by Attending Physician prior to discharge.  Discharge destination:  Home  Is patient on multiple antipsychotic therapies at discharge:  No   Has Patient had three or more failed trials of antipsychotic  monotherapy by history:  No  Recommended Plan for Multiple Antipsychotic Therapies: NA  Allergies as of 03/25/2020   No Known Allergies     Medication List    STOP taking these medications   melatonin 1 MG Tabs tablet     TAKE these medications     Indication  acamprosate 333 MG tablet Commonly known as: CAMPRAL Take 2 tablets (666 mg total) by mouth 3 (three) times daily with meals. For alcoholism  Indication: Abuse or Misuse of Alcohol   citalopram 10 MG tablet Commonly known as: CELEXA Take 3 tablets (30 mg total) by mouth daily. For depression  Indication: Depression   gabapentin 300 MG capsule Commonly known as: NEURONTIN Take 1 capsule (300 mg total) by mouth 2 (two) times daily. For agitation  Indication: Agitation   hydrOXYzine 25 MG tablet Commonly known as: ATARAX/VISTARIL Take 1 tablet (25 mg total) by mouth every 6 (six) hours as needed for anxiety.  Indication: Feeling Anxious   nicotine 14 mg/24hr patch Commonly known as: NICODERM CQ - dosed in  mg/24 hours Place 14 mg onto the skin daily.  Indication: Nicotine Addiction   omega-3 acid ethyl esters 1 g capsule Commonly known as: LOVAZA Take 1 capsule (1 g total) by mouth 2 (two) times daily. For hypertriglyceridemia  Indication: High Amount of Triglycerides in the Blood   sodium chloride 0.65 % Soln nasal spray Commonly known as: OCEAN Place 1 spray into both nostrils as needed for congestion.  Indication: Congestion   traZODone 100 MG tablet Commonly known as: DESYREL Take 1 tablet (100 mg total) by mouth at bedtime as needed for sleep.  Indication: Trouble Sleeping       Follow-up Information    [facility]. Go on 03/30/2020.   Specialty: Behavioral Health Why: You have an appointment for therapy on 03/30/20 at 4:00 pm. You also have an appointment for medication management on 04/10/20 at 4:30 pm.  These appointments will be held in person, please arrive 15 minutes prior to your appointment. Contact information: [address] [place] [id number] [phone]             Follow-up recommendations: Activity:  As tolerated Diet: As recommended by your primary care doctor. Keep all scheduled follow-up appointments as recommended.   Comments: Prescriptions given at discharge.  Patient agreeable to plan.  Given opportunity to ask questions.  Appears to feel comfortable with discharge denies any current suicidal or homicidal thought. Patient is also instructed prior to discharge to: Take all medications as prescribed by his/her mental healthcare provider. Report any adverse effects and or reactions from the medicines to his/her outpatient provider promptly. Patient has been instructed & cautioned: To not engage in alcohol and or illegal drug use while on prescription medicines. In the event of worsening symptoms, patient is instructed to call the crisis hotline, 911 and or go to the nearest ED for appropriate evaluation and treatment  of symptoms. To follow-up with his/her primary care provider for your other medical issues, concerns and or health care needs.  Signed: Armandina StammerAgnes Grant Henkes, NP, PMHNP, FNP-BC 03/25/2020, 9:55 AM

## 2020-03-25 NOTE — Progress Notes (Signed)
  [facility] Adult Case Management Discharge Plan :  Will you be returning to the same living situation after discharge:  Yes,  with wife At discharge, do you have transportation home?: Yes,  wife Do you have the ability to pay for your medications: Yes,  states he has insurance  Release of information consent forms completed and emailed to Medical Records, then turned in to Medical Records by CSW.   Patient to Follow up at:  Follow-up Information    [facility]. Go on 03/30/2020.   Specialty: Behavioral Health Why: You have an appointment for therapy on 03/30/20 at 4:00 pm. You also have an appointment for medication management on 04/10/20 at 4:30 pm.  These appointments will be held in person, please arrive 15 minutes prior to your appointment. Contact information: [address] [place] [address] [phone]              Next level of care provider has access to [facility] Link:yes  Safety Planning and Suicide Prevention discussed: Yes,  with wife     Has patient been referred to the Quitline?: N/A patient is not a smoker  Patient has been referred for addiction treatment: Yes  Lynnell Chad, LCSW 03/25/2020, 9:52 AM

## 2020-03-25 NOTE — [facility] Group Notes (Signed)
[facility] LCSW Group Therapy Note ° °Date/Time:    03/25/2020 10:00-11:00AM ° °Type of Therapy and Topic:  Group Therapy:  Healthy vs Unhealthy Coping Skills ° °Participation Level:  Did Not Attend  ° °Description of Group: ° °The focus of this group was to determine what unhealthy coping techniques typically are used by group members and what healthy coping techniques would be helpful in coping with various problems. Patients were guided in becoming aware of the differences between healthy and unhealthy coping techniques.  Patients were asked to identify 1-2 healthy coping skills they would like to learn to use more effectively, and many mentioned meditation, breathing, and relaxation.  At the end of group, additional ideas of healthy coping skills were shared in a fun exercise. ° °Therapeutic Goals °Patients learned that coping is what human beings do all day long to deal with various situations in their lives °Patients defined and discussed healthy vs unhealthy coping techniques °Patients identified their preferred coping techniques and identified whether these were healthy or unhealthy °Patients determined 1-2 healthy coping skills they would like to become more familiar with and use more often °Patients provided support and ideas to each other ° °Summary of Patient Progress: N/A ° ° °Therapeutic Modalities °Cognitive Behavioral Therapy °Motivational Interviewing ° ° °Mitesh Rosendahl Grossman-Orr, LCSW °03/25/2020, 9:18 AM °  ° °

## 2020-03-25 NOTE — Progress Notes (Addendum)
Staples from back of  pt's head (2 staples total) were removed.  Pt tolerated procedure well and without incident.  No bleeding noted after staples were removed.

## 2020-03-25 NOTE — [facility] Suicide Risk Assessment (Signed)
[facility] Discharge Suicide Risk Assessment   Principal Problem: <principal problem not specified> Discharge Diagnoses: Active Problems:   Severe recurrent major depression without psychotic features (HCC)   Alcohol dependence with alcohol-induced mood disorder (HCC)   Total Time spent with patient: 15 minutes  Musculoskeletal: Strength & Muscle Tone: within normal limits Gait & Station: normal Patient leans: N/A  Psychiatric Specialty Exam: Review of Systems  All other systems reviewed and are negative.   Blood pressure 112/83, pulse 87, temperature (!) 97.4 F (36.3 C), temperature source Oral, resp. rate 18, height 6\' 1"  (1.854 m), weight 108.9 kg, SpO2 99 %.Body mass index is 31.66 kg/m.  General Appearance: Casual  Eye Contact::  Fair  Speech:  Normal Rate409  Volume:  Normal  Mood:  Euthymic  Affect:  Congruent  Thought Process:  Coherent and Descriptions of Associations: Circumstantial  Orientation:  Full (Time, Place, and Person)  Thought Content:  Logical  Suicidal Thoughts:  No  Homicidal Thoughts:  No  Memory:  Immediate;   Fair Recent;   Fair Remote;   Fair  Judgement:  Intact  Insight:  Lacking  Psychomotor Activity:  Normal  Concentration:  Good  Recall:  Fair  Fund of Knowledge:Good  Language: Good  Akathisia:  Negative  Handed:  Right  AIMS (if indicated):     Assets:  Desire for Improvement Housing Resilience  Sleep:  Number of Hours: 3.5  Cognition: WNL  ADL's:  Intact   Mental Status Per Nursing Assessment::   On Admission:  Self-harm thoughts  Demographic Factors:  Male, Caucasian and Low socioeconomic status  Loss Factors: Legal issues  Historical Factors: Impulsivity  Risk Reduction Factors:   Living with another person, especially a relative and Positive social support  Continued Clinical Symptoms:  Depression:   Comorbid alcohol abuse/dependence Impulsivity Insomnia Alcohol/Substance Abuse/Dependencies  Cognitive Features  That Contribute To Risk:  None    Suicide Risk:  Minimal: No identifiable suicidal ideation.  Patients presenting with no risk factors but with morbid ruminations; may be classified as minimal risk based on the severity of the depressive symptoms   Follow-up Information    [facility]. Go on 03/30/2020.   Specialty: Behavioral Health Why: You have an appointment for therapy on 03/30/20 at 4:00 pm. You also have an appointment for medication management on 04/10/20 at 4:30 pm.  These appointments will be held in person, please arrive 15 minutes prior to your appointment. Contact information: [address] [place] [phone]              Plan Of Care/Follow-up recommendations:  Activity:  ad lib  [phone], MD 03/25/2020, 7:47 AM

## 2020-03-25 NOTE — Progress Notes (Signed)
Pt reports that he's been feeling "better." He shared that he has noticed a decrease in his anxiety from a 10 to a 6 from taking his gabapentin. Pt has been in the milieu and seen in the dayroom interacting with other patients. Pt denies SI/HI and AVH. Active listening, reassurance, and support provided. Q 15 min safety checks continue. Pt's safety has been maintained.    03/24/20 2025  Psych Admission Type (Psych Patients Only)  Admission Status Involuntary  Psychosocial Assessment  Patient Complaints Anxiety  Eye Contact Fair  Facial Expression Anxious  Affect Appropriate to circumstance;Anxious  Speech Logical/coherent  Interaction Assertive  Motor Activity Other (Comment) (WNL)  Appearance/Hygiene Unremarkable  Behavior Characteristics Cooperative;Appropriate to situation;Anxious;Calm  Mood Anxious;Pleasant  Thought Process  Coherency WDL  Content WDL  Delusions None reported or observed  Perception WDL  Hallucination None reported or observed  Judgment Impaired  Confusion None  Danger to Self  Current suicidal ideation? Denies  Danger to Others  Danger to Others None reported or observed

## 2020-03-25 NOTE — Progress Notes (Signed)
RN met with pt and reviewed pt's discharge instructions.  Pt verbalized understanding of discharge instructions and pt did not have any questions. RN reviewed and provided pt with a copy of SRA, AVS and Transition Record.  RN returned pt's belongings to pt.  Prescriptions and samples were given to pt.  Pt denied SI/HI/AVH and voiced no concerns.  Pt was appreciative of the care pt received at [facility].  Patient discharged to the lobby where pt's family member was waiting for pt.

## 2020-03-30 ENCOUNTER — Ambulatory Visit (HOSPITAL_COMMUNITY): Payer: Federal, State, Local not specified - Other | Admitting: Licensed Clinical Social Worker

## 2020-04-10 ENCOUNTER — Encounter (HOSPITAL_COMMUNITY): Payer: No Payment, Other | Admitting: Psychiatry

## 2021-04-04 IMAGING — CT CT ABD-PELV W/ CM
2 of 5 series · 15 of 46 positions shown, 17 images · IV contrast (omnipaque)
Comparison: None.

CLINICAL DATA: Concern for fall secondary to suicide attempt.

EXAM:
CT CHEST, ABDOMEN, AND PELVIS WITH CONTRAST
TECHNIQUE: Multidetector CT imaging of the chest, abdomen and pelvis was
performed following the standard protocol during bolus
administration of intravenous contrast.
CONTRAST:  100mL OMNIPAQUE IOHEXOL 300 MG/ML  SOLN

[Series 3: cap with 5mm st · axial · 0.98mm/px · z∈[-971,-356]mm · 12 of 147 slices shown, 14 images]
[im 12/147  soft-tissue]
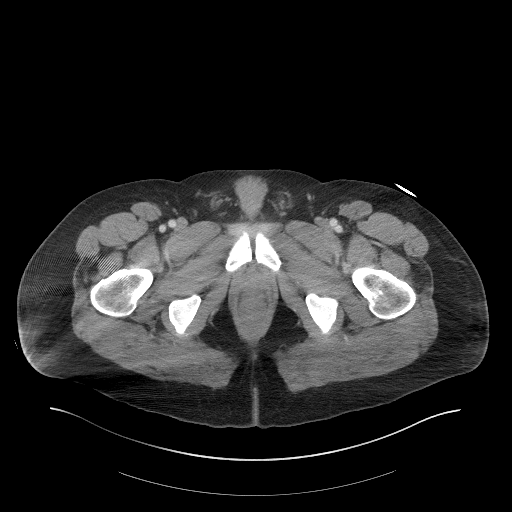
[im 12/147  bone]
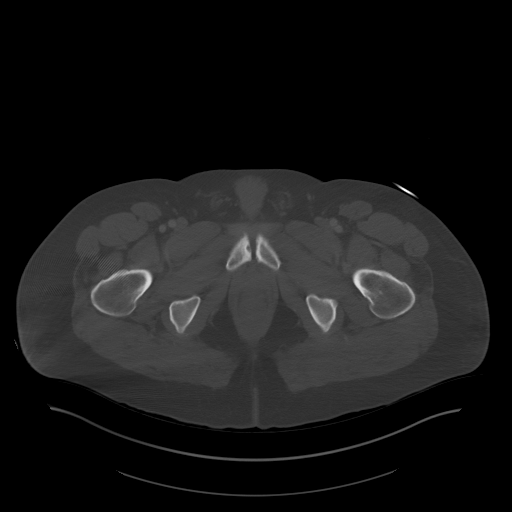
[im 23/147  soft-tissue]
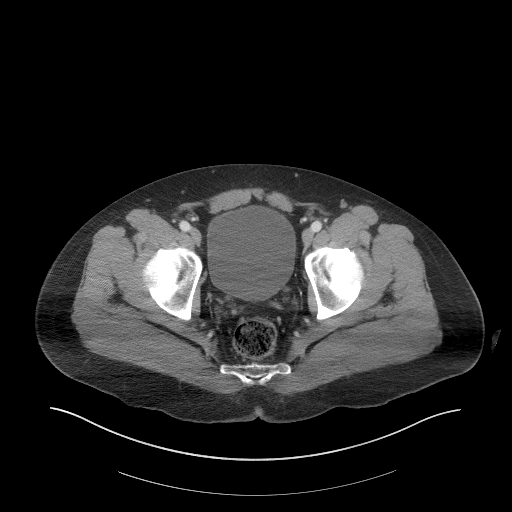
[im 34/147  soft-tissue]
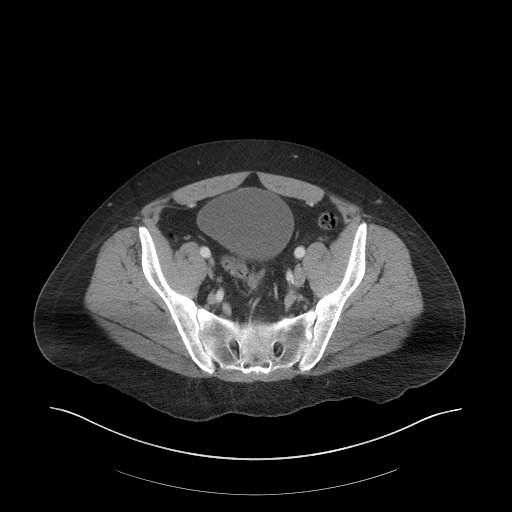
[im 45/147  soft-tissue]
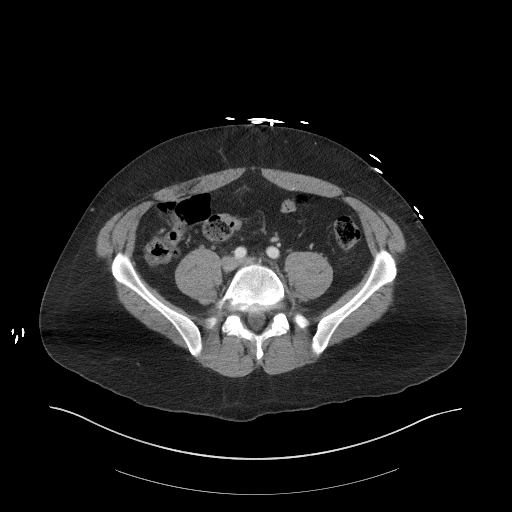
[im 57/147  soft-tissue]
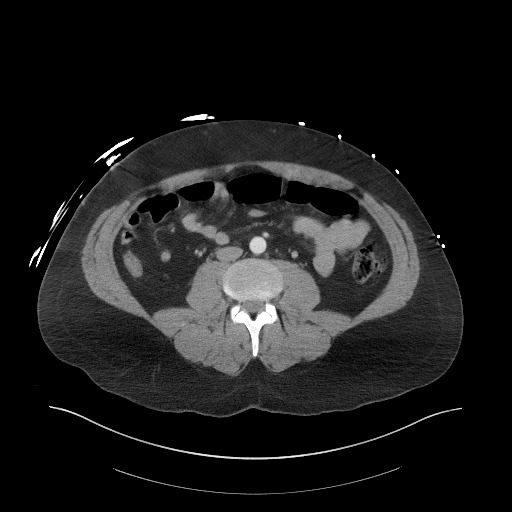
[im 68/147  soft-tissue]
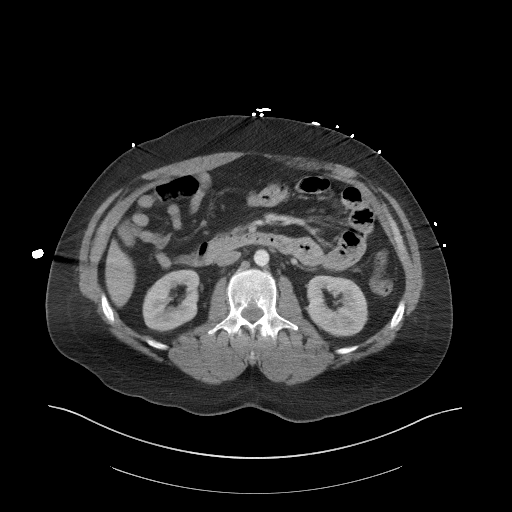
[im 79/147  soft-tissue]
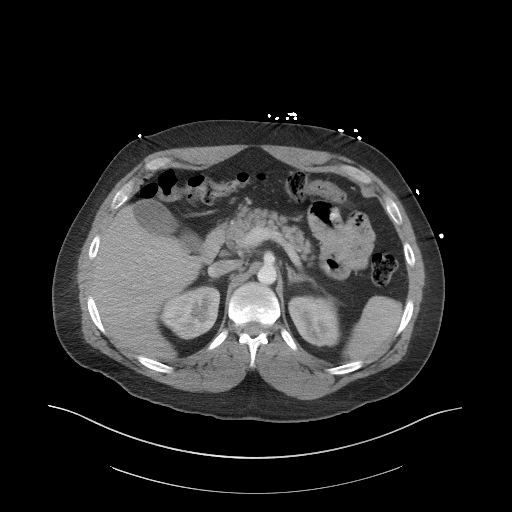
[im 90/147  soft-tissue]
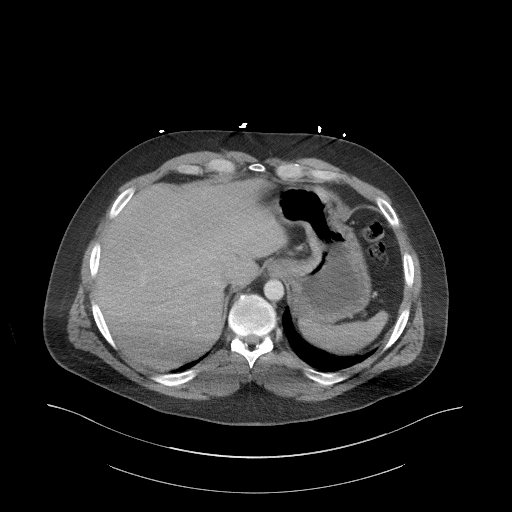
[im 102/147  soft-tissue]
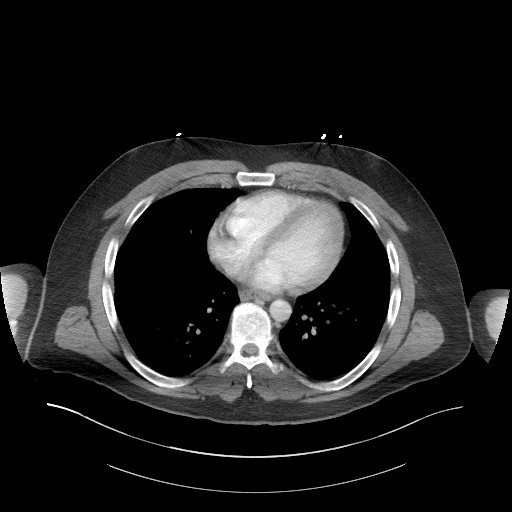
[im 102/147  bone]
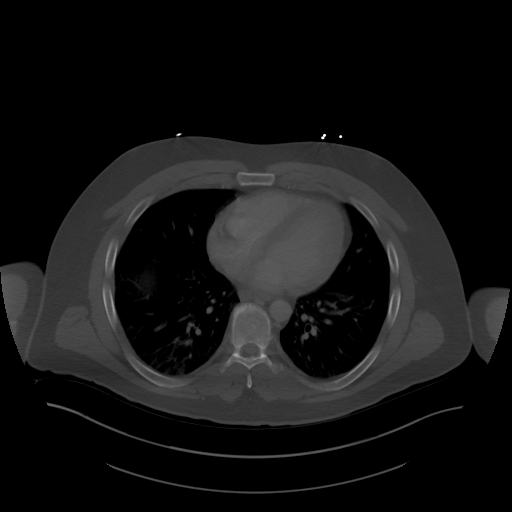
[im 113/147  soft-tissue]
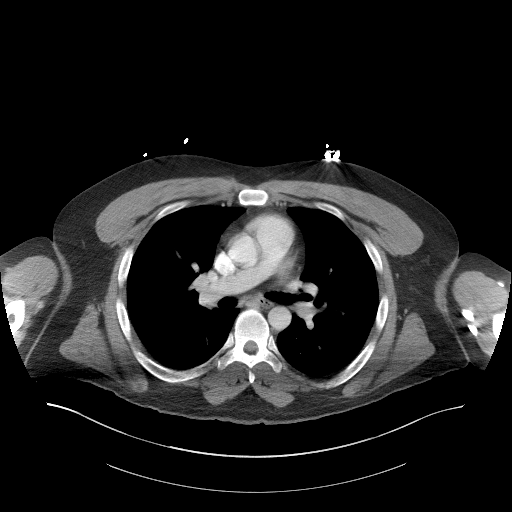
[im 124/147  soft-tissue]
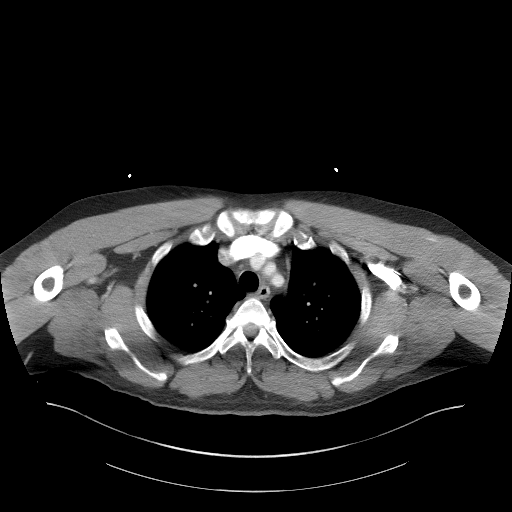
[im 135/147  soft-tissue]
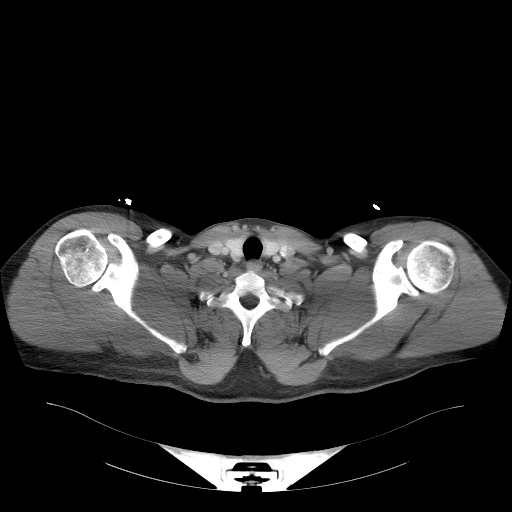

[Series 6: cap with 3mm st cor · coronal · 0.91mm/px · 3 of 148 slices shown]
[im 50/148  soft-tissue]
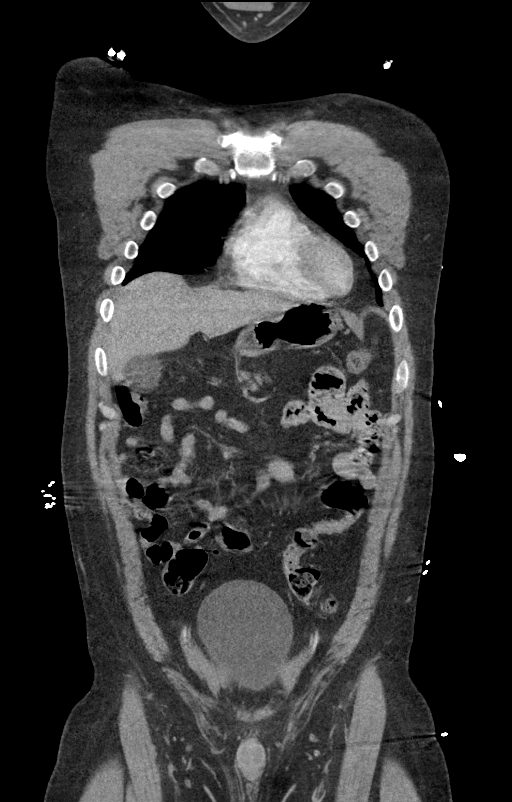
[im 66/148  soft-tissue]
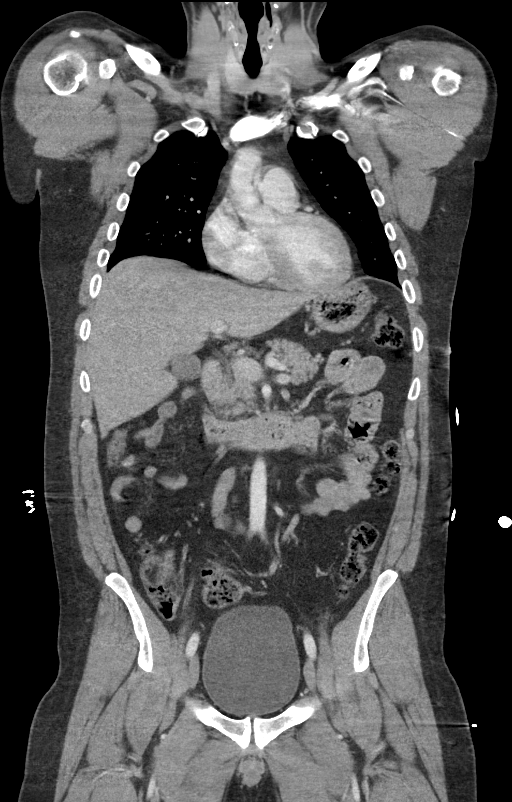
[im 82/148  soft-tissue]
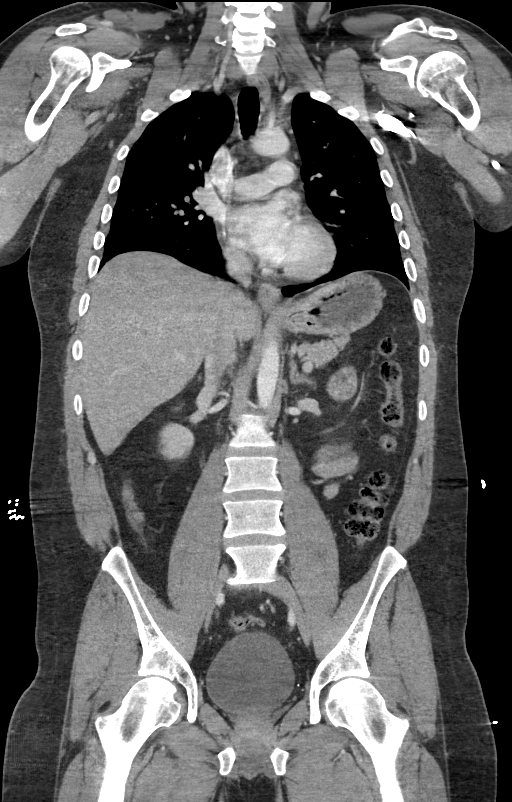

[15 of 46 positions shown; findings below may reference images not displayed]

FINDINGS: CT CHEST FINDINGS

Cardiovascular: The thoracic aorta is unremarkable. The heart size
is unremarkable. There is no significant pericardial effusion. There
is no large centrally located pulmonary embolism.

Mediastinum/Nodes:

-- No mediastinal lymphadenopathy.

-- No hilar lymphadenopathy.

-- No axillary lymphadenopathy.

-- No supraclavicular lymphadenopathy.

-- Normal thyroid gland where visualized.

-  Unremarkable esophagus.

Lungs/Pleura: Airways are patent. No pleural effusion, lobar
consolidation, pneumothorax or pulmonary infarction.

Musculoskeletal: No chest wall abnormality. No bony spinal canal
stenosis.

CT ABDOMEN PELVIS FINDINGS

Hepatobiliary: The liver is normal. Normal gallbladder.There is no
biliary ductal dilation.

Pancreas: Normal contours without ductal dilatation. No
peripancreatic fluid collection.

Spleen: Unremarkable.

Adrenals/Urinary Tract:

--Adrenal glands: Unremarkable.

--Right kidney/ureter: No hydronephrosis or radiopaque kidney
stones.

--Left kidney/ureter: No hydronephrosis or radiopaque kidney stones.

--Urinary bladder: Unremarkable.

Stomach/Bowel:

--Stomach/Duodenum: No hiatal hernia or other gastric abnormality.
Normal duodenal course and caliber.

--Small bowel: Unremarkable.

--Colon: Unremarkable.

--Appendix: Normal.

Vascular/Lymphatic: Normal course and caliber of the major abdominal
vessels.

--No retroperitoneal lymphadenopathy.

--No mesenteric lymphadenopathy.

--No pelvic or inguinal lymphadenopathy.

Reproductive: Unremarkable

Other: No ascites or free air. The abdominal wall is normal.

Musculoskeletal. There is a bilateral pars defect at L5 resulting in
grade 1 anterolisthesis of L5 on S1.
IMPRESSION: No acute thoracic, abdominal or pelvic injury.

## 2024-09-15 ENCOUNTER — Ambulatory Visit: Payer: Self-pay

## 2024-09-15 NOTE — Telephone Encounter (Signed)
 Hx of hypothyroidism, has not taken levothyroxine for 1 year.   FYI Only or Action Required?: FYI only for provider: appointment scheduled on Feb 6.   Called Nurse Triage reporting Fatigue.  Symptoms began several months ago.  Interventions attempted: Nothing.  Symptoms are: stable.  Triage Disposition: See PCP Within 2 Weeks  Patient/caregiver understands and will follow disposition?: Yes  Copied from CRM [id number]. Topic: Clinical - Red Word Triage >> Sep 15, 2024  2:06 PM Antwanette L wrote: Red Word that prompted transfer to Nurse Triage: The patient is experiencing severe fatigue and stomach pain and needs to establish care at [facility] Reason for Disposition  Fatigue is a chronic symptom (recurrent or ongoing AND present > 4 weeks)  Answer Assessment - Initial Assessment Questions 1. DESCRIPTION: Describe how you are feeling.     Exhausted  2. SEVERITY: How bad is it?  Can you stand and walk?     Yes he can walk  3. ONSET: When did these symptoms begin? (e.g., hours, days, weeks, months)     2 months   4. CAUSE: What do you think is causing the weakness or fatigue? (e.g., not drinking enough fluids, medical problem, trouble sleeping) Has been off levothyroxine for 1 year  6. OTHER SYMPTOMS: Do you have any other symptoms? (e.g., chest pain, fever, cough, SOB, vomiting, diarrhea, bleeding, other areas of pain) Bloating, denies SOB or CP at this time  Protocols used: Weakness (Generalized) and Fatigue-A-AH

## 2024-09-16 NOTE — Telephone Encounter (Signed)
 Noted

## 2024-10-15 ENCOUNTER — Ambulatory Visit: Payer: Self-pay | Admitting: Family Medicine
